# Patient Record
Sex: Female | Born: 1975 | Race: Black or African American | Hispanic: No | Marital: Married | Smoking: Never smoker
Health system: Southern US, Community
[De-identification: ages and names within clinical notes are randomized; demographics above are authoritative.]

## PROBLEM LIST (undated history)

## (undated) DIAGNOSIS — C801 Malignant (primary) neoplasm, unspecified: Secondary | ICD-10-CM

## (undated) DIAGNOSIS — Z803 Family history of malignant neoplasm of breast: Secondary | ICD-10-CM

## (undated) DIAGNOSIS — K649 Unspecified hemorrhoids: Secondary | ICD-10-CM

## (undated) DIAGNOSIS — Z800 Family history of malignant neoplasm of digestive organs: Secondary | ICD-10-CM

## (undated) DIAGNOSIS — Z806 Family history of leukemia: Secondary | ICD-10-CM

## (undated) DIAGNOSIS — J302 Other seasonal allergic rhinitis: Secondary | ICD-10-CM

## (undated) DIAGNOSIS — Z8042 Family history of malignant neoplasm of prostate: Secondary | ICD-10-CM

## (undated) HISTORY — PX: HEMORROIDECTOMY: SUR656

## (undated) HISTORY — DX: Family history of malignant neoplasm of breast: Z80.3

## (undated) HISTORY — DX: Family history of leukemia: Z80.6

## (undated) HISTORY — DX: Family history of malignant neoplasm of digestive organs: Z80.0

## (undated) HISTORY — DX: Unspecified hemorrhoids: K64.9

## (undated) HISTORY — DX: Family history of malignant neoplasm of prostate: Z80.42

## (undated) HISTORY — DX: Other seasonal allergic rhinitis: J30.2

---

## 2001-04-30 ENCOUNTER — Other Ambulatory Visit: Admission: RE | Admit: 2001-04-30 | Discharge: 2001-04-30 | Payer: Self-pay | Admitting: Obstetrics and Gynecology

## 2001-10-20 ENCOUNTER — Inpatient Hospital Stay (HOSPITAL_COMMUNITY): Admission: AD | Admit: 2001-10-20 | Discharge: 2001-10-20 | Payer: Self-pay | Admitting: *Deleted

## 2001-10-21 ENCOUNTER — Inpatient Hospital Stay (HOSPITAL_COMMUNITY): Admission: AD | Admit: 2001-10-21 | Discharge: 2001-10-21 | Payer: Self-pay | Admitting: Obstetrics and Gynecology

## 2001-11-04 ENCOUNTER — Inpatient Hospital Stay (HOSPITAL_COMMUNITY): Admission: AD | Admit: 2001-11-04 | Discharge: 2001-11-04 | Payer: Self-pay | Admitting: *Deleted

## 2001-11-28 ENCOUNTER — Inpatient Hospital Stay (HOSPITAL_COMMUNITY): Admission: AD | Admit: 2001-11-28 | Discharge: 2001-11-30 | Payer: Self-pay | Admitting: *Deleted

## 2002-01-06 ENCOUNTER — Other Ambulatory Visit: Admission: RE | Admit: 2002-01-06 | Discharge: 2002-01-06 | Payer: Self-pay | Admitting: Obstetrics and Gynecology

## 2002-04-13 ENCOUNTER — Emergency Department (HOSPITAL_COMMUNITY): Admission: EM | Admit: 2002-04-13 | Discharge: 2002-04-13 | Payer: Self-pay

## 2003-08-01 ENCOUNTER — Other Ambulatory Visit: Admission: RE | Admit: 2003-08-01 | Discharge: 2003-08-01 | Payer: Self-pay | Admitting: Obstetrics and Gynecology

## 2005-07-25 ENCOUNTER — Inpatient Hospital Stay (HOSPITAL_COMMUNITY): Admission: AD | Admit: 2005-07-25 | Discharge: 2005-07-25 | Payer: Self-pay | Admitting: Obstetrics and Gynecology

## 2005-08-19 ENCOUNTER — Inpatient Hospital Stay (HOSPITAL_COMMUNITY): Admission: AD | Admit: 2005-08-19 | Discharge: 2005-08-21 | Payer: Self-pay | Admitting: Obstetrics and Gynecology

## 2008-07-17 ENCOUNTER — Inpatient Hospital Stay (HOSPITAL_COMMUNITY): Admission: AD | Admit: 2008-07-17 | Discharge: 2008-07-17 | Payer: Self-pay | Admitting: Obstetrics

## 2008-08-06 ENCOUNTER — Inpatient Hospital Stay (HOSPITAL_COMMUNITY): Admission: AD | Admit: 2008-08-06 | Discharge: 2008-08-07 | Payer: Self-pay | Admitting: Obstetrics and Gynecology

## 2010-04-13 ENCOUNTER — Ambulatory Visit (HOSPITAL_COMMUNITY): Admission: RE | Admit: 2010-04-13 | Discharge: 2010-04-13 | Payer: Self-pay | Admitting: Obstetrics and Gynecology

## 2010-12-15 LAB — CBC
MCH: 30.9 pg (ref 26.0–34.0)
MCHC: 33.9 g/dL (ref 30.0–36.0)
Platelets: 248 10*3/uL (ref 150–400)
RDW: 13.2 % (ref 11.5–15.5)

## 2010-12-15 LAB — PREGNANCY, URINE: Preg Test, Ur: NEGATIVE

## 2011-02-15 NOTE — Consult Note (Signed)
NAME:  Anita Mora, Anita Mora               ACCOUNT NO.:  [id number]   MEDICAL RECORD NO.:  [id number]          PATIENT TYPE:  MAT   LOCATION:  [facility]                          FACILITY:  [facility]   PHYSICIAN:  Lenoard Aden, M.D.DATE OF BIRTH:  12-02-1975   DATE OF CONSULTATION:  07/25/2005  DATE OF DISCHARGE:                                   CONSULTATION   CHIEF COMPLAINT:  Rule out rupture of membranes.   HISTORY OF PRESENT ILLNESS:  She is a 35 year old African-American female,  G3, P1, EDD August 28, 2005, at 107 weeks' gestation with questionable  leakage of fluid today.  She has no known drug allergies.  Medications are  prenatal vitamins.  She has a history of a TAB in 1999 and a spontaneous  vaginal delivery in 2003.  She is a nonsmoker, nondrinker.  __________.  She  has a family history of diabetes, hypertension, congestive heart failure,  myocardial infarction.  She has a personal history of hemorrhoidal surgery  in 2003 and a pilonidal cyst removal.  Prenatal course complicated by  thrombosed hemorrhoid which was incised and drained in the office.   PRENATAL LAB DATA:  Blood type B positive, Rh antibody negative, rubella  immune, hepatitis and HIV negative.  GBS is not performed to this date.   PHYSICAL EXAMINATION:  GENERAL:  She is a well-developed, well-nourished  African-American female in no acute distress.  HEENT:  Normal.  LUNGS:  Clear.  HEART:  Regular rate and rhythm.  ABDOMEN:  Soft, gravid, and nontender.  NST is reactive, irregular  contractions noted.  CERVIX:  __________ long, vertex, and ballottable.   IMPRESSION:  1.  Thirty-five week OB.  2.  Fern and Nitrazine negative, no evidence of spontaneous rupture of      membranes.   PLAN:  Discharge home.  Preterm labor precautions given.  Follow up in the  office is scheduled.      Lenoard Aden, M.D.  Electronically Signed     RJT/MEDQ  D:  07/25/2005  T:  07/25/2005  Job:  [id number]

## 2011-07-02 LAB — URINE CULTURE: Special Requests: NEGATIVE

## 2011-07-02 LAB — CBC
HCT: 33.2 — ABNORMAL LOW
MCHC: 34.5
MCV: 95.3
Platelets: 195
Platelets: 209
RBC: 3.48 — ABNORMAL LOW
RDW: 13.6

## 2011-07-02 LAB — URINALYSIS, DIPSTICK ONLY
Nitrite: NEGATIVE
Specific Gravity, Urine: 1.02
Urobilinogen, UA: 0.2
pH: 7.5

## 2014-02-21 ENCOUNTER — Encounter: Payer: Self-pay | Admitting: Emergency Medicine

## 2014-02-21 ENCOUNTER — Emergency Department (INDEPENDENT_AMBULATORY_CARE_PROVIDER_SITE_OTHER)
Admission: EM | Admit: 2014-02-21 | Discharge: 2014-02-21 | Disposition: A | Discharge status: Home / Self Care | Payer: 59 | Source: Home / Self Care | Attending: Family Medicine | Admitting: Family Medicine

## 2014-02-21 ENCOUNTER — Emergency Department (INDEPENDENT_AMBULATORY_CARE_PROVIDER_SITE_OTHER): Payer: 59

## 2014-02-21 DIAGNOSIS — M25579 Pain in unspecified ankle and joints of unspecified foot: Secondary | ICD-10-CM

## 2014-02-21 DIAGNOSIS — S93602A Unspecified sprain of left foot, initial encounter: Secondary | ICD-10-CM

## 2014-02-21 DIAGNOSIS — R9389 Abnormal findings on diagnostic imaging of other specified body structures: Secondary | ICD-10-CM

## 2014-02-21 DIAGNOSIS — S93609A Unspecified sprain of unspecified foot, initial encounter: Secondary | ICD-10-CM

## 2014-02-21 MED ORDER — MELOXICAM 15 MG PO TABS: 15.0000 mg | ORAL_TABLET | Freq: Every day | ORAL | Status: DC

## 2014-02-21 MED ORDER — HYDROCODONE-ACETAMINOPHEN 5-325 MG PO TABS: 1.0000 | ORAL_TABLET | Freq: Four times a day (QID) | ORAL | Status: DC | PRN

## 2014-02-21 NOTE — Discharge Instructions (Signed)
Continue ace wrap.  Apply ice pack for 20 to 30 minutes, 3 to 4 times daily  Continue until pain decreases.  Elevate leg.  Use crutches.   Foot Sprain The muscles and cord like structures which attach muscle to bone (tendons) that surround the feet are made up of units. A foot sprain can occur at the weakest spot in any of these units. This condition is most often caused by injury to or overuse of the foot, as from playing contact sports, or aggravating a previous injury, or from poor conditioning, or obesity. SYMPTOMS  Pain with movement of the foot.  Tenderness and swelling at the injury site.  Loss of strength is present in moderate or severe sprains. THE THREE GRADES OR SEVERITY OF FOOT SPRAIN ARE:  Mild (Grade I): Slightly pulled muscle without tearing of muscle or tendon fibers or loss of strength.  Moderate (Grade II): Tearing of fibers in a muscle, tendon, or at the attachment to bone, with small decrease in strength.  Severe (Grade III): Rupture of the muscle-tendon-bone attachment, with separation of fibers. Severe sprain requires surgical repair. Often repeating (chronic) sprains are caused by overuse. Sudden (acute) sprains are caused by direct injury or over-use. DIAGNOSIS  Diagnosis of this condition is usually by your own observation. If problems continue, a caregiver may be required for further evaluation and treatment. X-rays may be required to make sure there are not breaks in the bones (fractures) present. Continued problems may require physical therapy for treatment. PREVENTION  Use strength and conditioning exercises appropriate for your sport.  Warm up properly prior to working out.  Use athletic shoes that are made for the sport you are participating in.  Allow adequate time for healing. Early return to activities makes repeat injury more likely, and can lead to an unstable arthritic foot that can result in prolonged disability. Mild sprains generally heal in 3 to  10 days, with moderate and severe sprains taking 2 to 10 weeks. Your caregiver can help you determine the proper time required for healing. HOME CARE INSTRUCTIONS   Apply ice to the injury for 15-20 minutes, 03-04 times per day. Put the ice in a plastic bag and place a towel between the bag of ice and your skin.  An elastic wrap (like an Ace bandage) may be used to keep swelling down.  Keep foot above the level of the heart, or at least raised on a footstool, when swelling and pain are present.  Try to avoid use other than gentle range of motion while the foot is painful. Do not resume use until instructed by your caregiver. Then begin use gradually, not increasing use to the point of pain. If pain does develop, decrease use and continue the above measures, gradually increasing activities that do not cause discomfort, until you gradually achieve normal use.  Use crutches if and as instructed, and for the length of time instructed.  Keep injured foot and ankle wrapped between treatments.  Massage foot and ankle for comfort and to keep swelling down. Massage from the toes up towards the knee.  Only take over-the-counter or prescription medicines for pain, discomfort, or fever as directed by your caregiver. SEEK IMMEDIATE MEDICAL CARE IF:   Your pain and swelling increase, or pain is not controlled with medications.  You have loss of feeling in your foot or your foot turns cold or blue.  You develop new, unexplained symptoms, or an increase of the symptoms that brought you to your caregiver.  MAKE SURE YOU:   Understand these instructions.  Will watch your condition.  Will get help right away if you are not doing well or get worse. Document Released: 03/08/2002 Document Revised: 12/09/2011 Document Reviewed: 05/05/2008 [facility] Patient Information 2014 [place], [place].

## 2014-02-21 NOTE — ED Provider Notes (Signed)
CSN: [id number]     Arrival date & time 02/21/14  1932 History   First MD Initiated Contact with Patient 02/21/14 1946     Chief Complaint  Patient presents with  . Ankle Pain     HPI Comments: Patient was playing softball today.  While sliding into second, she twisted her left foot/ankle and heard a popping sound.  She has pain in her medial foot/ankle.  Patient is a 38 y.o. female presenting with ankle pain. The history is provided by the patient.  Ankle Pain Location:  Ankle and foot Time since incident:  2 hours Injury: yes   Mechanism of injury comment:  Playing softball Ankle location:  L ankle Foot location:  L foot Pain details:    Quality:  Shooting   Radiates to:  L leg   Severity:  Moderate   Onset quality:  Sudden   Duration:  2 hours   Timing:  Constant   Progression:  Unchanged Chronicity:  New Prior injury to area:  No Relieved by:  Nothing Worsened by:  Bearing weight Ineffective treatments: ace wrap. Associated symptoms: decreased ROM, stiffness and swelling   Associated symptoms: no back pain, no muscle weakness, no numbness and no tingling     History reviewed. No pertinent past medical history. Past Surgical History  Procedure Laterality Date  . Hemorroidectomy     Family History  Problem Relation Age of Onset  . Hypertension Mother    History  Substance Use Topics  . Smoking status: Never Smoker   . Smokeless tobacco: Not on file  . Alcohol Use: Yes     Comment: occassional   OB History   Grav Para Term Preterm Abortions TAB SAB Ect Mult Living                 Review of Systems  Musculoskeletal: Positive for stiffness. Negative for back pain.    Allergies  Review of patient's allergies indicates no known allergies.  Home Medications   Prior to Admission medications   Medication Sig Start Date End Date Taking? Authorizing Provider  MULTIPLE VITAMIN PO Take by mouth.   Yes Historical Provider, MD   BP 108/71  Pulse 92  Temp(Src)  99.8 F (37.7 C) (Oral)  Resp 18  SpO2 100%  LMP 02/10/2014 Physical Exam  Nursing note and vitals reviewed. Constitutional: She is oriented to person, place, and time. She appears well-developed and well-nourished. No distress.  HENT:  Head: Atraumatic.  Eyes: Conjunctivae are normal. Pupils are equal, round, and reactive to light.  Musculoskeletal:       Left ankle: She exhibits decreased range of motion. She exhibits no swelling, no ecchymosis, no deformity, no laceration and normal pulse. Tenderness. Medial malleolus tenderness found. No lateral malleolus, no AITFL, no CF ligament, no posterior TFL, no head of 5th metatarsal and no proximal fibula tenderness found. Achilles tendon normal.       Feet:  Left ankle has tenderness over medial malleolus and navicular, extending to dorsum of foot.  Distal neurovascular function is intact.   Neurological: She is alert and oriented to person, place, and time.  Skin: Skin is warm and dry.    ED Course  Procedures      Imaging Review Dg Ankle Complete Left  02/21/2014   CLINICAL DATA:  38 year old female with left ankle injury and pain.  EXAM: LEFT ANKLE COMPLETE - 3+ VIEW  COMPARISON:  None.  FINDINGS: There is no evidence of fracture, dislocation, or joint  effusion. There is no evidence of arthropathy or other focal bone abnormality. Soft tissues are unremarkable.  IMPRESSION: Negative.   Electronically Signed   By: Hassan Rowan M.D.   On: 02/21/2014 20:24   Dg Foot Complete Left  02/21/2014   CLINICAL DATA:  Pain post injury playing softball  EXAM: LEFT FOOT - COMPLETE 3+ VIEW  COMPARISON:  None.  FINDINGS: Three views of the left foot submitted. No acute fracture. There is mild misalignment of talonavicular joint. Ligamental injury or subtle inferior subluxation of the navicular cannot be excluded. Clinical correlation is necessary. Further evaluation with MRI could be performed.  IMPRESSION: No acute fracture. There is mild misalignment of  talonavicular joint. Ligamental injury or subtle inferior subluxation of the navicular cannot be excluded. Clinical correlation is necessary. Further evaluation with MRI could be performed.   Electronically Signed   By: Lahoma Crocker M.D.   On: 02/21/2014 20:26     MDM   1. Sprain of foot, left    Ace wrap applied.  Administered 800mg  Ibuprofen.  Rx for Lortab.  Begin Mobic tomorrow. Continue ace wrap.  Apply ice pack for 20 to 30 minutes, 3 to 4 times daily  Continue until pain decreases.  Elevate leg.  Use crutches. Followup with Dr. Aundria Mems as soon as possible.    Kandra Nicolas, MD 02/21/14 [phone]

## 2014-02-21 NOTE — ED Notes (Signed)
Pt c/o LT ankle pain post injury playing softball today.  She reports hearing a "pop".

## 2014-02-22 ENCOUNTER — Telehealth: Payer: Self-pay | Admitting: *Deleted

## 2014-02-23 ENCOUNTER — Encounter: Payer: Self-pay | Admitting: Sports Medicine

## 2014-02-23 ENCOUNTER — Ambulatory Visit (INDEPENDENT_AMBULATORY_CARE_PROVIDER_SITE_OTHER): Payer: 59 | Admitting: Sports Medicine

## 2014-02-23 ENCOUNTER — Ambulatory Visit (INDEPENDENT_AMBULATORY_CARE_PROVIDER_SITE_OTHER): Payer: 59

## 2014-02-23 VITALS — BP 141/80 | HR 106 | Ht 68.0 in | Wt 191.0 lb

## 2014-02-23 DIAGNOSIS — S93402A Sprain of unspecified ligament of left ankle, initial encounter: Secondary | ICD-10-CM | POA: Insufficient documentation

## 2014-02-23 DIAGNOSIS — S93409A Sprain of unspecified ligament of unspecified ankle, initial encounter: Secondary | ICD-10-CM

## 2014-02-23 DIAGNOSIS — Z5189 Encounter for other specified aftercare: Secondary | ICD-10-CM

## 2014-02-23 NOTE — Assessment & Plan Note (Signed)
Clinically this does represent a lateral ankle sprain. Radiologically she does have a misalignment between the talus and the navicular, with dorsal misalignment of the talus. I am going to x-ray her contralateral foot to see if this is simply a normal variant. Strap the foot with compressive dressing, continue cast boot, crutches. Home rehabilitation exercises, Mobic patient already has, return to see me in 2 weeks.

## 2014-02-23 NOTE — Progress Notes (Signed)
   Subjective:    I'm seeing this patient as a consultation for:  Dr. Assunta Found  CC: Left ankle injury  HPI: This is a very pleasant 38 year old female, she was playing softball, rounded the base, and inverted her left ankle. She had immediate pain, swelling, and inability to bear weight. She was seen in urgent care where x-ray showed what appeared to be a malalignment between the talus and the navicular, she was placed in a boot and referred to me for further evaluation and definitive treatment. Pain is moderate, persistent. Localized predominately of the lateral ankle. She only has minimal pain at the talonavicular joint.  Past medical history, Surgical history, Family history not pertinant except as noted below, Social history, Allergies, and medications have been entered into the medical record, reviewed, and no changes needed.   Review of Systems: No headache, visual changes, nausea, vomiting, diarrhea, constipation, dizziness, abdominal pain, skin rash, fevers, chills, night sweats, weight loss, swollen lymph nodes, body aches, joint swelling, muscle aches, chest pain, shortness of breath, mood changes, visual or auditory hallucinations.   Objective:   General: Well Developed, well nourished, and in no acute distress.  Neuro/Psych: Alert and oriented x3, extra-ocular muscles intact, able to move all 4 extremities, sensation grossly intact. Skin: Warm and dry, no rashes noted.  Respiratory: Not using accessory muscles, speaking in full sentences, trachea midline.  Cardiovascular: Pulses palpable, no extremity edema. Abdomen: Does not appear distended. Left Ankle: Visibly swollen, no bruising. Range of motion is full in all directions. Strength is 5/5 in all directions. Stable medial ligaments; squeeze test and kleiger test unremarkable; negative talar tilt. Talar dome nontender; No pain at base of 5th MT; No tenderness over cuboid; No tenderness over N spot or navicular prominence Only  minimal tenderness to palpation of the talonavicular joint, predominately painful over the ATF L., with a mildly positive anterior drawer sign. No tenderness on posterior aspects of lateral and medial malleolus No sign of peroneal tendon subluxations or tenderness to palpation Negative tarsal tunnel tinel's Able to walk 4 steps.  On the lateral view of the left foot there does appear to be some dorsal malalignment of the talus with respect to the navicular. On imaging of the right foot, alignment is similar suggesting that this is a normal variant.  The foot was then strapped with compressive dressing and placed back into the cast boot.  Impression and Recommendations:   This case required medical decision making of moderate complexity.

## 2014-02-25 ENCOUNTER — Telehealth: Payer: Self-pay | Admitting: *Deleted

## 2014-02-25 NOTE — Telephone Encounter (Signed)
Pt called and stated that she has been doing the exercises that Dr. Darene Lamer gave her and she is still in pain. Pt states that Dr. Darene Lamer told her that she should improve in 2 weeks and she has not. She has been elevating her foot, and using ice Q 2-3 hours. She has other concerns about the boot that she has been wearing she stated that it's hot and wanted to know if she can be put in a shorter boot, lastly she wanted to know if she needed a scooter or if this is too much because the crutches are too much? 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 Please advise.Teddy Spike

## 2014-02-25 NOTE — Telephone Encounter (Signed)
I said 2 weeks, it's only been 2 days, I am happy to give her a shorter boot, they are expensive though, other option would be a cast. I am also happy to give her a prescription for a knee scooter, she just needs to let me know if she wants this. Is the hydrocodone sufficient for controlling pain?

## 2014-02-28 NOTE — Telephone Encounter (Signed)
lvm informing pt of recommendations and to call back .Anita Mora

## 2014-03-09 ENCOUNTER — Ambulatory Visit: Payer: 59 | Admitting: Sports Medicine

## 2014-03-11 ENCOUNTER — Ambulatory Visit (INDEPENDENT_AMBULATORY_CARE_PROVIDER_SITE_OTHER): Payer: 59 | Admitting: Sports Medicine

## 2014-03-11 ENCOUNTER — Encounter: Payer: Self-pay | Admitting: Sports Medicine

## 2014-03-11 VITALS — BP 109/67 | HR 86 | Ht 67.0 in | Wt 191.0 lb

## 2014-03-11 DIAGNOSIS — S93402A Sprain of unspecified ligament of left ankle, initial encounter: Secondary | ICD-10-CM

## 2014-03-11 DIAGNOSIS — S93409A Sprain of unspecified ligament of unspecified ankle, initial encounter: Secondary | ICD-10-CM

## 2014-03-11 NOTE — Progress Notes (Signed)
    Subjective:    CC: Follow up  HPI: Left ankle sprain: X-rays were negative, has now been in a cast boot for 2 weeks, pain is significantly better. Mild, improving.  Past medical history, Surgical history, Family history not pertinant except as noted below, Social history, Allergies, and medications have been entered into the medical record, reviewed, and no changes needed.   Review of Systems: No fevers, chills, night sweats, weight loss, chest pain, or shortness of breath.   Objective:    General: Well Developed, well nourished, and in no acute distress.  Neuro: Alert and oriented x3, extra-ocular muscles intact, sensation grossly intact.  HEENT: Normocephalic, atraumatic, pupils equal round reactive to light, neck supple, no masses, no lymphadenopathy, thyroid nonpalpable.  Skin: Warm and dry, no rashes. Cardiac: Regular rate and rhythm, no murmurs rubs or gallops, no lower extremity edema. Respiratory: Clear to auscultation bilaterally. Not using accessory muscles, speaking in full sentences. Left Ankle: Only minimal residual swelling Range of motion is full in all directions. Strength is 5/5 in all directions. Stable lateral and medial ligaments; squeeze test and kleiger test unremarkable; only minimal tenderness to palpation over the ATF L. There is also mild tenderness to palpation on the fibular shaft distally. Talar dome nontender; No pain at base of 5th MT; No tenderness over cuboid; No tenderness over N spot or navicular prominence No tenderness on posterior aspects of lateral and medial malleolus No sign of peroneal tendon subluxations or tenderness to palpation Negative tarsal tunnel tinel's Able to walk 4 steps.  Impression and Recommendations:

## 2014-03-11 NOTE — Assessment & Plan Note (Signed)
Continues to improve, continue boot for an additional week, then transition into regular footwear. Strapped with compressive dressing. Return in 3 weeks.

## 2014-04-04 ENCOUNTER — Encounter: Payer: Self-pay | Admitting: Sports Medicine

## 2014-04-04 ENCOUNTER — Ambulatory Visit (INDEPENDENT_AMBULATORY_CARE_PROVIDER_SITE_OTHER): Payer: 59 | Admitting: Sports Medicine

## 2014-04-04 ENCOUNTER — Telehealth: Payer: Self-pay | Admitting: *Deleted

## 2014-04-04 VITALS — BP 103/69 | HR 81 | Ht 68.0 in | Wt 187.0 lb

## 2014-04-04 DIAGNOSIS — S93402D Sprain of unspecified ligament of left ankle, subsequent encounter: Secondary | ICD-10-CM

## 2014-04-04 DIAGNOSIS — S93409A Sprain of unspecified ligament of unspecified ankle, initial encounter: Secondary | ICD-10-CM

## 2014-04-04 MED ORDER — MELOXICAM 15 MG PO TABS: ORAL_TABLET | ORAL | Status: DC

## 2014-04-04 NOTE — Progress Notes (Signed)
  Subjective:    CC: Followup  HPI: This is a very pleasant 38 year old female, one month ago she had an inversion injury to her left ankle, and we treated her conservatively with a cast boot, home rehabilitation, and anti-inflammatories.  She has improved significantly but she has still some pain at the 1st MTP and over the ATFL  She is now off Mobic and is wearing flip-flops.  Past medical history, Surgical history, Family history not pertinant except as noted below, Social history, Allergies, and medications have been entered into the medical record, reviewed, and no changes needed.   Review of Systems: No fevers, chills, night sweats, weight loss, chest pain, or shortness of breath.   Objective:    General: Well Developed, well nourished, and in no acute distress.  Neuro: Alert and oriented x3, extra-ocular muscles intact, sensation grossly intact.  HEENT: Normocephalic, atraumatic, pupils equal round reactive to light, neck supple, no masses, no lymphadenopathy, thyroid nonpalpable.  Skin: Warm and dry, no rashes. Cardiac: Regular rate and rhythm, no murmurs rubs or gallops, no lower extremity edema.  Respiratory: Clear to auscultation bilaterally. Not using accessory muscles, speaking in full sentences. Left Ankle: No visible erythema or swelling. Range of motion is full in all directions. Strength is 5/5 in all directions. Stable lateral and medial ligaments; squeeze test and kleiger test unremarkable; Still tender over the ATFL and the lateral talar dome No pain at base of 5th MT; No tenderness over cuboid; No tenderness over N spot or navicular prominence No tenderness on posterior aspects of lateral and medial malleolus No sign of peroneal tendon subluxations or tenderness to palpation Negative tarsal tunnel tinel's Able to walk 4 steps.  Impression and Recommendations:

## 2014-04-04 NOTE — Telephone Encounter (Signed)
MRI of ankle was denied and peer to peer review needs done. REF# [id number]

## 2014-04-04 NOTE — Assessment & Plan Note (Signed)
Continues to be painful over the ATF L., with what sounds to be peroneal tendon subluxation. She is also having some pain at the first metatarsophalangeal joint. At this point we are going to proceed with an MRI of the ankle. Return to go over MRI results, will back into ASO brace and restart Mobic. She did decline a first metatarsophalangeal joint injection today.

## 2014-04-07 ENCOUNTER — Telehealth: Payer: Self-pay | Admitting: *Deleted

## 2014-04-07 NOTE — Telephone Encounter (Signed)
Peer to peer completed and approval from [facility] rec'd - [id number] good 04/06/14-05/21/14. Erin @ radiology notified.  Margette Fast, CMA

## 2014-07-22 ENCOUNTER — Telehealth: Payer: Self-pay

## 2014-07-22 DIAGNOSIS — M25572 Pain in left ankle and joints of left foot: Secondary | ICD-10-CM

## 2014-07-22 NOTE — Telephone Encounter (Signed)
Spoke to patient advised her that order for MRI was placed. Anita Mora,CMA

## 2014-07-22 NOTE — Telephone Encounter (Signed)
Patient request another order for MRI of the ankle. She stated that she was unable to get previous ones done due to her having to reschedule many missed appts.  Rhonda Cunningham,CMA

## 2014-07-22 NOTE — Telephone Encounter (Signed)
Orders placed, this was previously approved

## 2014-07-26 ENCOUNTER — Telehealth: Payer: Self-pay | Admitting: *Deleted

## 2014-07-26 NOTE — Telephone Encounter (Signed)
MRI approval for [id number] (ankle) [id number] expires 09/02/14. Radiology notified. Margette Fast, RMA

## 2014-08-22 ENCOUNTER — Other Ambulatory Visit: Payer: 59

## 2020-09-28 ENCOUNTER — Other Ambulatory Visit: Payer: Self-pay | Admitting: Radiology

## 2020-10-04 ENCOUNTER — Other Ambulatory Visit: Payer: Self-pay | Admitting: Radiology

## 2020-10-04 DIAGNOSIS — C50912 Malignant neoplasm of unspecified site of left female breast: Secondary | ICD-10-CM

## 2020-10-06 ENCOUNTER — Encounter: Payer: Self-pay | Admitting: *Deleted

## 2020-10-06 DIAGNOSIS — C773 Secondary and unspecified malignant neoplasm of axilla and upper limb lymph nodes: Secondary | ICD-10-CM | POA: Insufficient documentation

## 2020-10-06 DIAGNOSIS — C50919 Malignant neoplasm of unspecified site of unspecified female breast: Secondary | ICD-10-CM | POA: Insufficient documentation

## 2020-10-09 ENCOUNTER — Other Ambulatory Visit: Payer: Self-pay

## 2020-10-09 ENCOUNTER — Encounter: Payer: Self-pay | Admitting: Obstetrics and Gynecology

## 2020-10-09 ENCOUNTER — Ambulatory Visit
Admission: RE | Admit: 2020-10-09 | Discharge: 2020-10-09 | Disposition: A | Discharge status: Home / Self Care | Payer: Managed Care, Other (non HMO) | Source: Ambulatory Visit | Attending: Radiology | Admitting: Radiology

## 2020-10-09 DIAGNOSIS — C50912 Malignant neoplasm of unspecified site of left female breast: Secondary | ICD-10-CM

## 2020-10-09 MED ORDER — GADOBUTROL 1 MMOL/ML IV SOLN
8.0000 mL | Freq: Once | INTRAVENOUS | Status: AC | PRN
  Administered 2020-10-09: 8 mL via INTRAVENOUS

## 2020-10-10 NOTE — Progress Notes (Signed)
Anita Mora  [phone] [phone]) [phone]     ID: Anita Mora DOB: 1976-08-19  MR#: [id number]  AJO#:[id number]  Patient Care Team: Pcp, No as PCP - General Anita Germany, RN as Oncology Nurse Navigator Anita Kaufmann, RN as Oncology Nurse Navigator Anita Luna, MD as Consulting Physician (General Surgery) Anita Mora, Anita Dad, MD as Consulting Physician (Oncology) Anita Gibson, MD as Attending Physician (Radiation Oncology) Anita Cruel, MD OTHER MD:  CHIEF COMPLAINT: triple positive breast cancer  CURRENT TREATMENT: Neoadjuvant chemotherapy   HISTORY OF CURRENT ILLNESS: Anita Mora had routine screening mammography on 08/21/2020 showing a possible abnormality in the left axilla. She underwent left breast ultrasonography at [facility] on 09/18/2020 showing: three abnormal lymph nodes, at least two of which have lost their fatty hila and are suspicious for possible malignancy; 1.4 cm irregular mass in left breast at 3 o'clock; 1.7 cm oval simple cyst in left breast at 2 o'clock is benign.  Accordingly on 09/28/2020 she proceeded to biopsy of the left axilla area in question. The pathology from this procedure ([id number]) showed: lymph node with metastatic carcinoma. Prognostic indicators significant for: estrogen receptor, 50% positive and progesterone receptor, 80% positive, both with moderate staining intensity. Proliferation marker Ki67 at 30%. HER2 positive by immunohistochemistry (3+).  The left breast mass at 3 o'clock was also biopsied at that time and showed a fibroepithelial lesion. No invasive carcinoma was identified.  She underwent breast MRI on 10/09/2020 showing: breast composition C; two abnormal-appearing left axillary lymph nodes, one of which is biopsy-proven to contain metastatic disease; initially no suspicious findings within left breast were noted, but with subsequent review a 0.6 cm area in the left breast will need  biopsy; also an indeterminate 1 cm lower-inner right breast mass will require biopsy as well   The patient's subsequent history is as detailed below.   INTERVAL HISTORY: Anita Mora was evaluated in the multidisciplinary breast cancer clinic on 10/11/2020 accompanied by her Anita Mora her case was also presented at the multidisciplinary breast cancer conference on the same day. At that time a preliminary plan was proposed: Staging studies, neoadjuvant chemotherapy, genetics, definitive surgery, radiation, antiestrogens   REVIEW OF SYSTEMS: There were no specific symptoms leading to the original mammogram, which was routinely scheduled. The patient denies unusual headaches, visual changes, nausea, vomiting, stiff neck, dizziness, or gait imbalance. There has been no cough, phlegm production, or pleurisy, no chest pain or pressure, and no change in bowel or bladder habits. The patient denies fever, rash, bleeding, unexplained fatigue or unexplained weight loss. A detailed review of systems was otherwise entirely negative.   COVID 19 VACCINATION STATUS: Status post Pfizer January 2022   PAST MEDICAL HISTORY: Past Medical History:  Diagnosis Date  . Family history of breast cancer   . Family history of colon cancer   . Family history of leukemia   . Family history of prostate cancer   . Hemorrhoids   . Seasonal allergies     PAST SURGICAL HISTORY: Past Surgical History:  Procedure Laterality Date  . HEMORROIDECTOMY      FAMILY HISTORY: Family History  Problem Relation Age of Onset  . Hypertension Mother   . Breast cancer Mother 54       gene testing  - neg at Anita Mora  . Colon cancer Maternal Grandmother        dx 80s  . Prostate cancer Paternal Uncle        dx 95s  .  Prostate cancer Maternal Uncle        60's  . Aneurysm Maternal Grandfather 76  . Stroke Paternal Grandmother   . Leukemia Other 10       Paternal cousin's son  . Prostate cancer Maternal Uncle 80  . Prostate  cancer Paternal Uncle        dx 44s   Her parents are both living-- father at 3, mother at 56-- as of 09/2020. Anita Mora has one brother and one Anita. She reports breast cancer in her mother at age 25 (she had genetic testing done at [facility], which was negative), prostate cancer in two paternal uncles (both in their 18's) and two maternal uncles (one at 58, one in 69's), and colon cancer in her maternal grandmother in her 30's.   GYNECOLOGIC HISTORY:  No LMP recorded. Menarche: 45 years old Age at first live birth: 45 years old [place] P 3 LMP 10/06/2020 Contraceptive: used for more than 2 years with no complications; husband now s/p vasectomy HRT n/a  Hysterectomy? no BSO? no   SOCIAL HISTORY: (updated 09/2020)  Anita Mora works as a [employer]. Spouse Anita Mora is a Music therapist with [employer]. She lives at home with Anita Mora, two of their children-- Anita Mora age 11, and Anita Mora age 61-- and their dog. Their third child, their daughter Anita Mora, age 80, is at [employer] in [place]. Anita Mora attends [employer].    ADVANCED DIRECTIVES: in place   HEALTH MAINTENANCE: Social History   Tobacco Use  . Smoking status: Never Smoker  Substance Use Topics  . Alcohol use: Yes    Comment: occassional  . Drug use: No     Colonoscopy: n/a (age)  PAP: up to date  Bone density: n/a (age)   No Known Allergies  Current Outpatient Medications  Medication Sig Dispense Refill  . meloxicam (MOBIC) 15 MG tablet One tab PO qAM with breakfast for 2 weeks, then daily prn pain. 30 tablet 3  . MULTIPLE VITAMIN PO Take by mouth.     No current facility-administered medications for this visit.    OBJECTIVE: African-American woman who appears well  Vitals:   10/11/20 0917  BP: 121/72  Pulse: 83  Resp: 18  Temp: (!) 97.5 F (36.4 C)  SpO2: 100%     Body mass index is 29.28 kg/m.   Wt Readings from Last 3 Encounters:  10/11/20 192 lb 9.6 oz (87.4 kg)  04/04/14 187 lb (84.8 kg)   03/11/14 191 lb (86.6 kg)      ECOG FS:0 - Asymptomatic  Ocular: Sclerae unicteric, pupils round and equal Ear-nose-throat: Wearing a mask Lymphatic: No cervical or supraclavicular adenopathy Lungs no rales or rhonchi Heart regular rate and rhythm Abd soft, nontender, positive bowel sounds MSK no focal spinal tenderness, no joint edema Neuro: non-focal, well-oriented, appropriate affect Breasts: The right breast is unremarkable.  The right breast is unremarkable as well.  In the left axilla there is an easily palpable mass which appears fixed, measuring approximately 2 cm   LAB RESULTS:  CMP     Component Value Date/Time   NA 137 10/11/2020 0845   K 4.5 10/11/2020 0845   CL 103 10/11/2020 0845   CO2 29 10/11/2020 0845   GLUCOSE 96 10/11/2020 0845   BUN 14 10/11/2020 0845   CREATININE 1.07 (H) 10/11/2020 0845   CALCIUM 9.6 10/11/2020 0845   PROT 8.2 (H) 10/11/2020 0845   ALBUMIN 4.0 10/11/2020 0845   AST 19 10/11/2020 0845  ALT 12 10/11/2020 0845   ALKPHOS 47 10/11/2020 0845   BILITOT 0.7 10/11/2020 0845   GFRNONAA >60 10/11/2020 0845    No results found for: TOTALPROTELP, ALBUMINELP, A1GS, A2GS, BETS, BETA2SER, GAMS, MSPIKE, SPEI  Lab Results  Component Value Date   WBC 4.9 10/11/2020   NEUTROABS 2.4 10/11/2020   HGB 11.3 (L) 10/11/2020   HCT 34.0 (L) 10/11/2020   MCV 89.0 10/11/2020   PLT 254 10/11/2020    No results found for: LABCA2  No components found for: ZWCHEN277  No results for input(s): INR in the last 168 hours.  No results found for: LABCA2  No results found for: OEU235  No results found for: TIR443  No results found for: XVQ008  No results found for: CA2729  No components found for: HGQUANT  No results found for: CEA1 / No results found for: CEA1   No results found for: AFPTUMOR  No results found for: CHROMOGRNA  No results found for: KPAFRELGTCHN, LAMBDASER, KAPLAMBRATIO (kappa/lambda light chains)  No results found for:  HGBA, HGBA2QUANT, HGBFQUANT, HGBSQUAN (Hemoglobinopathy evaluation)   No results found for: LDH  No results found for: IRON, TIBC, IRONPCTSAT (Iron and TIBC)  No results found for: FERRITIN  Urinalysis    Component Value Date/Time   LABSPEC 1.020 08/06/2008 0745   PHURINE 7.5 08/06/2008 0745   GLUCOSEU NEGATIVE 08/06/2008 0745   HGBUR LARGE (A) 08/06/2008 0745   BILIRUBINUR SMALL (A) 08/06/2008 0745   KETONESUR NEGATIVE 08/06/2008 0745   PROTEINUR 100 (A) 08/06/2008 0745   UROBILINOGEN 0.2 08/06/2008 0745   NITRITE NEGATIVE 08/06/2008 0745   LEUKOCYTESUR TRACE (A) 08/06/2008 0745     STUDIES: MR BREAST BILATERAL W WO CONTRAST INC CAD  Addendum Date: 10/11/2020   ADDENDUM REPORT: 10/11/2020 10:22 ADDENDUM: After further review, a 0.4 cm focus within the posterior central LEFT breast (series 12: Image 69) is identified. Given metastatic breast cancer within a LEFT axillary lymph node and no other suspicious abnormality within the LEFT breast, tissue sampling would be recommended to exclude malignancy. RECOMMENDATION: MR guided LEFT breast biopsy. BI-RADS category:  4: Suspicious. Dr. [name] is aware of this change. Electronically Signed   By: [name] M.D.   On: 10/11/2020 10:22   Result Date: 10/11/2020 CLINICAL DATA:  45 year old female with recent diagnosis of metastatic carcinoma within a LEFT axillary lymph node. Recent biopsy of RETROAREOLAR LEFT breast mass demonstrating a fibroepithelial lesion favored to be a fibroadenoma. LABS:  None performed today EXAM: BILATERAL BREAST MRI WITH AND WITHOUT CONTRAST TECHNIQUE: Multiplanar, multisequence MR images of both breasts were obtained prior to and following the intravenous administration of 8 ml of Gadavist Three-dimensional MR images were rendered by post-processing of the original MR data on an independent workstation. The three-dimensional MR images were interpreted, and findings are reported in the following complete MRI  report for this study. Three dimensional images were evaluated at the independent interpreting workstation using the DynaCAD thin client. COMPARISON:  Prior mammogram and ultrasounds from [facility] FINDINGS: Breast composition: c. Heterogeneous fibroglandular tissue. Background parenchymal enhancement: Mild Right breast: A 1 cm irregular mass is identified within the posterior LOWER INNER RIGHT breast (series 12: Image 98). No other suspicious mass or worrisome enhancement is identified. Multiple benign cysts are present throughout the RIGHT breast. Left breast: Biopsy clip artifact within the OUTER LEFT breast, middle depth noted. No suspicious abnormalities are identified within the LEFT breast. Multiple cysts are present throughout the LEFT breast. Lymph nodes: 2 abnormal appearing  LEFT axillary lymph nodes are identified, the largest 1 containing biopsy clip artifact. No other abnormal appearing lymph nodes are identified. Ancillary findings:  None. IMPRESSION: 1. Two abnormal appearing LEFT axillary lymph nodes, 1 of which is biopsy-proven to contain metastatic disease. No suspicious MR findings within the LEFT breast. 2. Indeterminate 1 cm LOWER INNER RIGHT breast mass. 2nd-look ultrasound and possible biopsy is recommended. If this mass is not identified sonographically, than MR guided biopsy would be recommended. RECOMMENDATION: 1. 2nd-look RIGHT breast ultrasound with possible biopsy of the 1 cm LOWER INNER RIGHT breast mass. If this mass is not identified sonographically, then MR guided RIGHT breast biopsy is recommended. BI-RADS CATEGORY  4: Suspicious. Electronically Signed: By: [name] M.D. On: 10/09/2020 15:33     ELIGIBLE FOR AVAILABLE RESEARCH PROTOCOL: AET  ASSESSMENT: 45 y.o. [place], [place] woman status post left axillary lymph node biopsy 09/28/2020 for adenocarcinoma, grade not stated, estrogen and progesterone receptor positive, HER2 amplified, with an MIB-1 of 30%  (a) biopsy of a left  breast upper outer quadrant mass 09/28/2020 showed fibroadenoma  (b) biopsy of a left breast 0.6 cm mass pending  (c) biopsy of a right breast 1 cm mass pending  (1) neoadjuvant chemotherapy will consist of carboplatin and docetaxel every 21 days x 4 given concurrently with trastuzumab and Pertuzumab  (2) trastuzumab and pertuzumab to be continued to complete a year  (a) echo  (3) definitive surgery to follow  (4) adjuvant radiation as  (5) genetics testing  (6) antiestrogens  PLAN: I met today with Anita Mora to review her new diagnosis. Specifically we discussed the biology of her breast cancer, its diagnosis, staging, treatment  options and prognosis.We first reviewed the fact that cancer is not one disease but more than 100 different diseases and that it is important to keep them separate-- otherwise when friends and relatives discuss their own cancer experiences with Anita Mora confusion can result. Similarly we explained that if breast cancer spreads to the bone or liver, the patient would not have bone cancer or liver cancer, but breast cancer in the bone and breast cancer in the liver: one cancer in three places-- not 3 different cancers which otherwise would have to be treated in 3 different ways.  We discussed the difference between local and systemic therapy. In terms of loco-regional treatment, lumpectomy plus radiation is equivalent to mastectomy as far as survival is concerned. For this reason, and because the cosmetic results are generally superior, we recommend breast conserving surgery.   We also noted that in terms of sequencing of treatments, whether systemic therapy or surgery is done first does not affect the ultimate outcome.  This is relevant to  Anita Mora' treatment plans, since we believe she will benefit from neoadjuvant chemo.  This will allow [place] to assess response, and also give time for genetics testing so optimal surgical planning can be undertaken.  Next we went over the options  for systemic therapy which are anti-estrogens, anti-HER-2 immunotherapy, and chemotherapy. Anita Mora meets criteria for all 3 and we specifically discussed the current standard of care and situations like hers, namely docetaxel, carboplatin, trastuzumab, and pertuzumab.  We discussed the possible toxicities side effects and complications of these agents and she will also meet with our chemotherapy teaching nurse for further discussion.  The patient has a trip to [place] in [place] 10/12/2020 returning 10/16/2020.  This will push port placement, echo, and her biopsies back a little but we are hoping to be able to start  her chemotherapy 10/26/2020  Anita Mora has a good understanding of the overall plan. She agrees with it. She knows the goal of treatment in her case is cure. She will call with any problems that may develop before her next visit here.  Total encounter time 65 minutes.Anita Mora Jews C. Columbia Pandey, MD 10/11/2020 7:06 PM Medical Oncology and Hematology [facility] [place], [place] [address] Tel. [phone]    Fax. [phone]   This document serves as a record of services personally performed by Lurline Del, MD. It was created on his behalf by Wilburn Mylar, a trained medical scribe. The creation of this record is based on the scribe's personal observations and the provider's statements to them.   I, Lurline Del MD, have reviewed the above documentation for accuracy and completeness, and I agree with the above.    *Total Encounter Time as defined by the Centers for Medicare and Medicaid Services includes, in addition to the face-to-face time of a patient visit (documented in the note above) non-face-to-face time: obtaining and reviewing outside history, ordering and reviewing medications, tests or procedures, care coordination (communications with other health care professionals or caregivers) and documentation in the medical record.

## 2020-10-11 ENCOUNTER — Inpatient Hospital Stay (HOSPITAL_BASED_OUTPATIENT_CLINIC_OR_DEPARTMENT_OTHER): Payer: Managed Care, Other (non HMO) | Admitting: Oncology

## 2020-10-11 ENCOUNTER — Encounter: Payer: Self-pay | Admitting: *Deleted

## 2020-10-11 ENCOUNTER — Other Ambulatory Visit: Payer: Self-pay

## 2020-10-11 ENCOUNTER — Inpatient Hospital Stay: Payer: Managed Care, Other (non HMO)

## 2020-10-11 ENCOUNTER — Encounter: Payer: Self-pay | Admitting: Physical Therapy

## 2020-10-11 ENCOUNTER — Ambulatory Visit
Admission: RE | Admit: 2020-10-11 | Discharge: 2020-10-11 | Disposition: A | Discharge status: Home / Self Care | Payer: Managed Care, Other (non HMO) | Source: Ambulatory Visit | Attending: Radiation Oncology | Admitting: Radiation Oncology

## 2020-10-11 ENCOUNTER — Encounter: Payer: Self-pay | Admitting: Radiation Oncology

## 2020-10-11 ENCOUNTER — Ambulatory Visit (HOSPITAL_BASED_OUTPATIENT_CLINIC_OR_DEPARTMENT_OTHER): Payer: Managed Care, Other (non HMO) | Admitting: Genetic Counselor

## 2020-10-11 ENCOUNTER — Encounter: Payer: Self-pay | Admitting: Licensed Clinical Social Worker

## 2020-10-11 ENCOUNTER — Encounter: Payer: Self-pay | Admitting: Genetic Counselor

## 2020-10-11 ENCOUNTER — Other Ambulatory Visit: Payer: Self-pay | Admitting: *Deleted

## 2020-10-11 ENCOUNTER — Ambulatory Visit: Payer: Managed Care, Other (non HMO) | Attending: Surgery | Admitting: Physical Therapy

## 2020-10-11 ENCOUNTER — Ambulatory Visit: Payer: Self-pay | Admitting: Surgery

## 2020-10-11 VITALS — BP 121/72 | HR 83 | Temp 97.5°F | Resp 18 | Ht 68.0 in | Wt 192.6 lb

## 2020-10-11 DIAGNOSIS — Z800 Family history of malignant neoplasm of digestive organs: Secondary | ICD-10-CM

## 2020-10-11 DIAGNOSIS — Z803 Family history of malignant neoplasm of breast: Secondary | ICD-10-CM

## 2020-10-11 DIAGNOSIS — C50412 Malignant neoplasm of upper-outer quadrant of left female breast: Secondary | ICD-10-CM

## 2020-10-11 DIAGNOSIS — C773 Secondary and unspecified malignant neoplasm of axilla and upper limb lymph nodes: Secondary | ICD-10-CM

## 2020-10-11 DIAGNOSIS — R293 Abnormal posture: Secondary | ICD-10-CM | POA: Insufficient documentation

## 2020-10-11 DIAGNOSIS — Z791 Long term (current) use of non-steroidal anti-inflammatories (NSAID): Secondary | ICD-10-CM | POA: Insufficient documentation

## 2020-10-11 DIAGNOSIS — Z8249 Family history of ischemic heart disease and other diseases of the circulatory system: Secondary | ICD-10-CM

## 2020-10-11 DIAGNOSIS — C50919 Malignant neoplasm of unspecified site of unspecified female breast: Secondary | ICD-10-CM

## 2020-10-11 DIAGNOSIS — Z8042 Family history of malignant neoplasm of prostate: Secondary | ICD-10-CM | POA: Insufficient documentation

## 2020-10-11 DIAGNOSIS — Z806 Family history of leukemia: Secondary | ICD-10-CM

## 2020-10-11 DIAGNOSIS — Z7952 Long term (current) use of systemic steroids: Secondary | ICD-10-CM | POA: Insufficient documentation

## 2020-10-11 DIAGNOSIS — Z23 Encounter for immunization: Secondary | ICD-10-CM | POA: Insufficient documentation

## 2020-10-11 DIAGNOSIS — Z170 Estrogen receptor positive status [ER+]: Secondary | ICD-10-CM

## 2020-10-11 LAB — CBC WITH DIFFERENTIAL (CANCER CENTER ONLY)
Abs Immature Granulocytes: 0 10*3/uL (ref 0.00–0.07)
Basophils Absolute: 0 10*3/uL (ref 0.0–0.1)
Basophils Relative: 1 %
Eosinophils Absolute: 0.1 10*3/uL (ref 0.0–0.5)
Eosinophils Relative: 1 %
HCT: 34 % — ABNORMAL LOW (ref 36.0–46.0)
Hemoglobin: 11.3 g/dL — ABNORMAL LOW (ref 12.0–15.0)
Immature Granulocytes: 0 %
Lymphocytes Relative: 42 %
Lymphs Abs: 2 10*3/uL (ref 0.7–4.0)
MCH: 29.6 pg (ref 26.0–34.0)
MCHC: 33.2 g/dL (ref 30.0–36.0)
MCV: 89 fL (ref 80.0–100.0)
Monocytes Absolute: 0.4 10*3/uL (ref 0.1–1.0)
Monocytes Relative: 7 %
Neutro Abs: 2.4 10*3/uL (ref 1.7–7.7)
Neutrophils Relative %: 49 %
Platelet Count: 254 10*3/uL (ref 150–400)
RBC: 3.82 MIL/uL — ABNORMAL LOW (ref 3.87–5.11)
RDW: 13.2 % (ref 11.5–15.5)
WBC Count: 4.9 10*3/uL (ref 4.0–10.5)
nRBC: 0 % (ref 0.0–0.2)

## 2020-10-11 LAB — CMP (CANCER CENTER ONLY)
ALT: 12 U/L (ref 0–44)
AST: 19 U/L (ref 15–41)
Albumin: 4 g/dL (ref 3.5–5.0)
Alkaline Phosphatase: 47 U/L (ref 38–126)
Anion gap: 5 (ref 5–15)
BUN: 14 mg/dL (ref 6–20)
CO2: 29 mmol/L (ref 22–32)
Calcium: 9.6 mg/dL (ref 8.9–10.3)
Chloride: 103 mmol/L (ref 98–111)
Creatinine: 1.07 mg/dL — ABNORMAL HIGH (ref 0.44–1.00)
GFR, Estimated: >60 mL/min (ref >60)
Glucose, Bld: 96 mg/dL (ref 70–99)
Potassium: 4.5 mmol/L (ref 3.5–5.1)
Sodium: 137 mmol/L (ref 135–145)
Total Bilirubin: 0.7 mg/dL (ref 0.3–1.2)
Total Protein: 8.2 g/dL — ABNORMAL HIGH (ref 6.5–8.1)

## 2020-10-11 LAB — GENETIC SCREENING ORDER

## 2020-10-11 MED ORDER — DEXAMETHASONE 4 MG PO TABS: 8.0000 mg | ORAL_TABLET | Freq: Two times a day (BID) | ORAL | 1 refills | Status: DC

## 2020-10-11 MED ORDER — LIDOCAINE-PRILOCAINE 2.5-2.5 % EX CREA: TOPICAL_CREAM | CUTANEOUS | 3 refills | Status: DC

## 2020-10-11 MED ORDER — PROCHLORPERAZINE MALEATE 10 MG PO TABS: 10.0000 mg | ORAL_TABLET | Freq: Four times a day (QID) | ORAL | 1 refills | Status: DC | PRN

## 2020-10-11 MED ORDER — LORAZEPAM 0.5 MG PO TABS: 0.5000 mg | ORAL_TABLET | Freq: Every evening | ORAL | 0 refills | Status: DC | PRN

## 2020-10-11 NOTE — H&P (Signed)
Anita Mora Appointment: 10/11/2020 9:00 AM Location: [facility] Patient #: [id number] DOB: Jul 11, 1976 Undefined / Language: Anita Mora / Race: Black or African American Female  History of Present Illness Anita Mora A. Sylvan Sookdeo Mora; 10/11/2020 11:06 AM) Patient words: Pt seen in [place] for left axillary mass core bx left breast cancer metastatic to axillary LN Primary site yet to be identified  Undergoing MRI bx next week   Under workup to identify occult source at this point She has seen oncology and needs a port for chemotherapy. Mass was detected by U/S.       ADDITIONAL INFORMATION: 2. PROGNOSTIC INDICATORS Results: IMMUNOHISTOCHEMICAL AND MORPHOMETRIC ANALYSIS PERFORMED MANUALLY The tumor cells are POSITIVE for Her2 (3+). Estrogen Receptor: 50%, POSITIVE, MODERATE STAINING INTENSITY Progesterone Receptor: 80%, POSITIVE, MODERATE STAINING INTENSITY Proliferation Marker Ki67: 30% REFERENCE RANGE ESTROGEN RECEPTOR NEGATIVE 0% POSITIVE =>1% REFERENCE RANGE PROGESTERONE RECEPTOR NEGATIVE 0% POSITIVE =>1% All controls stained appropriately Anita Mora Pathologist, Electronic Signature ( Signed 10/03/2020) FINAL DIAGNOSIS Diagnosis 1. Breast, left, needle core biopsy, 3:00 - FIBROEPITHELIAL LESION. 1 of 3 Amended copy Addendum FINAL for [place], [place] [phone]) Diagnosis(continued) - NO INVASIVE CARCINOMA IDENTIFIED. - SEE MICROSCOPIC DESCRIPTION. 2. Lymph node, needle/core biopsy, left axillary adenopathy - LYMPH NODE WITH METASTATIC CARCINOMA. - SEE MICROSCOPIC DESCRIPTION. Microscopic Comment 1. The biopsy shows portions of fibroepithelial lesion and fibroadenoma is favored. No invasive carcinoma is identified in these biopsies. 2. The core biopsies show lymph nodal tissue with metastatic carcinoma. Immunohistochemistry will be performed and reported as an addendum. Dr. Tresa Moore agrees. Called to [facility] on 10/02/20. (JDP:kh  10/02/20) ADDENDUM: Immunohistochemistry shows the metastatic carcinoma is positive for estrogen receptor, progesterone receptor and GATA-3. GCDFP is negative. The immunophenotype is consistent with metastatic breast carcinoma. A breast prognostic profile will be performed and reported separately. (JDP:kh 10/02/20) Anita Mora Pathologist, Electronic Signature (Case signed 10/02/2020)        ADDENDUM REPORT: 10/11/2020 10:22 ADDENDUM: After further review, a 0.4 cm focus within the posterior central LEFT breast (series 12: Image 69) is identified. Given metastatic breast cancer within a LEFT axillary lymph node and no other suspicious abnormality within the LEFT breast, tissue sampling would be recommended to exclude malignancy. RECOMMENDATION: MR guided LEFT breast biopsy. BI-RADS category: 4: Suspicious. Dr. [name] is aware of this change. Electronically Signed By: Anita Canada M.D. On: 10/11/2020 10:22  Addended by Anita Canada, Mora on 10/11/2020 10:26 AM   Study Result  Narrative & Impression CLINICAL DATA: 45 year old female with recent diagnosis of metastatic carcinoma within a LEFT axillary lymph node. Recent biopsy of RETROAREOLAR LEFT breast mass demonstrating a fibroepithelial lesion favored to be a fibroadenoma. LABS: None performed today EXAM: BILATERAL BREAST MRI WITH AND WITHOUT CONTRAST TECHNIQUE: Multiplanar, multisequence MR images of both breasts were obtained prior to and following the intravenous administration of 8 ml of Gadavist Three-dimensional MR images were rendered by post-processing of the original MR data on an independent workstation. The three-dimensional MR images were interpreted, and findings are reported in the following complete MRI report for this study. Three dimensional images were evaluated at the independent interpreting workstation using the DynaCAD thin client. COMPARISON: Prior mammogram and ultrasounds from [facility]  FINDINGS: Breast composition: c. Heterogeneous fibroglandular tissue. Background parenchymal enhancement: Mild Right breast: A 1 cm irregular mass is identified within the posterior LOWER INNER RIGHT breast (series 12: Image 98). No other suspicious mass or worrisome enhancement is identified. Multiple benign cysts are present throughout the RIGHT breast. Left  breast: Biopsy clip artifact within the OUTER LEFT breast, middle depth noted. No suspicious abnormalities are identified within the LEFT breast. Multiple cysts are present throughout the LEFT breast. Lymph nodes: 2 abnormal appearing LEFT axillary lymph nodes are identified, the largest 1 containing biopsy clip artifact. No other abnormal appearing lymph nodes are identified. Ancillary findings: None. IMPRESSION: 1. Two abnormal appearing LEFT axillary lymph nodes, 1 of which is biopsy-proven to contain metastatic disease. No suspicious MR findings within the LEFT breast. 2. Indeterminate 1 cm LOWER INNER RIGHT breast mass. 2nd-look ultrasound and possible biopsy is recommended. If this mass is not identified sonographically, than MR guided biopsy would be recommended. RECOMMENDATION: 1. 2nd-look RIGHT breast ultrasound with possible biopsy of the 1 cm LOWER INNER RIGHT breast mass. If this mass is not identified sonographically, then MR guided RIGHT breast biopsy is recommended. BI-RADS CATEGORY 4: Suspicious. Electronically Signed: By: Anita Canada M.D. On: 10/09/2020 15:33.  The patient is a 45 year old female.   Medication History Anita Slipper, RN; 10/11/2020 8:20 AM) Medications Reconciled     Physical Exam (Anita Mora Anita Mora; 10/11/2020 11:09 AM)  General Mental Status-Alert. General Appearance-Consistent with stated age. Hydration-Well hydrated. Voice-Normal.  Chest and Lung Exam Chest and lung exam reveals -quiet, even and easy respiratory effort with no use of accessory muscles and on  auscultation, normal breath sounds, no adventitious sounds and normal vocal resonance. Inspection Chest Wall - Normal. Back - normal.  Breast Breast - Left-Symmetric, Non Tender, No Biopsy scars, no Dimpling - Left, No Inflammation, No Lumpectomy scars, No Mastectomy scars, No Peau d' Orange. Breast - Right-Symmetric, Non Tender, No Biopsy scars, no Dimpling - Right, No Inflammation, No Lumpectomy scars, No Mastectomy scars, No Peau d' Orange. Breast Lump-No Palpable Breast Mass.  Cardiovascular Cardiovascular examination reveals -normal heart sounds, regular rate and rhythm with no murmurs and normal pedal pulses bilaterally.  Neurologic Neurologic evaluation reveals -alert and oriented x 3 with no impairment of recent or remote memory. Mental Status-Normal.  Musculoskeletal Normal Exam - Left-Upper Extremity Strength Normal and Lower Extremity Strength Normal. Normal Exam - Right-Upper Extremity Strength Normal and Lower Extremity Strength Normal.  Lymphatic Axillary -Note:left axillary adenopathy 2 - 3 cm mobile smaller nodes noted right axilla normal.     Assessment & Plan (Emauri Krygier A. Flo Berroa Mora; 10/11/2020 11:11 AM)  BREAST CANCER METASTASIZED TO AXILLARY LYMPH NODE, LEFT (C50.912) Impression: finish work up port for chemotherapy surgery at a later time  Pt requires port placement for chemotherapy. Risk include bleeding, infection, pneumothorax, hemothorax, mediastinal injury, nerve injury , blood vessel injury, stroke, blood clots, death, migration. embolization and need for additional procedures. Pt agrees to proceed.  Current Plans Use of a central venous catheter for intravenous therapy was discussed. Technique of catheter placement using ultrasound and fluoroscopy guidance was discussed. Risks such as bleeding, infection, pneumothorax, catheter occlusion, reoperation, and other risks were discussed. I noted a good likelihood this will help address  the problem. Questions were answered. The patient expressed understanding & wishes to proceed. Pt Education - [employer] Free Text Education/Instructions: discussed with patient and provided information.

## 2020-10-11 NOTE — Progress Notes (Signed)
   Covid-19 Vaccination Clinic  Name:  TYNIA WIERS    MRN: [id number] DOB: December 11, 1975  10/11/2020  Ms. Andaya was observed post Covid-19 immunization for 15 minutes without incident. She was provided with Vaccine Information Sheet and instruction to access the V-Safe system.   Ms. Fiorillo was instructed to call 911 with any severe reactions post vaccine: [name] Kitchen Difficulty breathing  . Swelling of face and throat  . A fast heartbeat  . A bad rash all over body  . Dizziness and weakness

## 2020-10-11 NOTE — Progress Notes (Signed)
Radiation Oncology         [phone]) [phone] ________________________________  Initial Outpatient Consultation  Name: Anita Mora MRN: [id number]  Date: 10/11/2020  DOB: 17-Oct-1975  CC:Pcp, No  Erroll Luna, MD   REFERRING PHYSICIAN: Erroll Luna, MD  DIAGNOSIS: C77.3    ICD-10-CM   1. 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 female who presented with left breast abnormality on the following imaging: bilateral screening mammogram on the date of 08/21/2020. No symptoms were reported at that time. Ultrasound of breast on 09/18/2020 revealed three abnormal left axillary lymph nodes, at least two of which had lost their fatty hila and were suspicious for possible malignancy. There was also noted to be a 1.4 x 0.9 x 0.7 cm irregular mass in the left breast at the 3 o'clock position along the areolar margin that was suspicious for malignancy. Finally, there was a 1.7 cm oval simple cyst in the left breast at the 2 o'clock position at middle depth that was benign.   Biopsy on the date of 09/28/2020 showed metastatic carcinoma of the left axillary lymph node. ER status: 50% moderate; PR status: 80% moderate; Her2 status: positive; Grade not provided thus far by pathology. Left breast mass at the 3 o'clock position showed fibroepithelial lesion without invasive carcinoma.  MRI of bilateral breasts on 10/09/2020 showed two abnormal appearing left axillary lymph nodes, one of which was biopsy-proven to contain metastatic disease. There was also noted  to be an indeterminate 1 cm lower inner right breast mass. Second-look ultrasound and possible biopsy was recommended.  In conference this morning radiology also mentioned that there is a 6 mm oval lesion in the left breast that warrants biopsy.  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  She has received COVID vaccinations x2 and is due for her booster.  She denies previous radiation therapy.  She is interested in having a patient mentor.  PREVIOUS RADIATION THERAPY: No  PAST MEDICAL HISTORY:  has a past medical history of Family history of breast cancer, Family history of colon cancer, Family history of leukemia, Family history of prostate cancer, Hemorrhoids, and Seasonal allergies.    PAST SURGICAL HISTORY: Past Surgical History:  Procedure Laterality Date  . HEMORROIDECTOMY      FAMILY HISTORY: family history includes Aneurysm (age of onset: 46) in her maternal grandfather; Breast cancer (age of onset: 31) in her mother; Colon cancer in her maternal grandmother; Hypertension in her mother; Leukemia (age of onset: 14) in an other family member; Prostate cancer in her maternal uncle, paternal uncle, and paternal uncle; Prostate cancer (age of onset: 71) in her maternal uncle; Stroke in her paternal grandmother.  SOCIAL HISTORY:  reports that she has never smoked. She does not have any smokeless tobacco history on file. She reports current alcohol use. She reports that she does not use drugs.  ALLERGIES: Patient has no known allergies.  MEDICATIONS:  Current Outpatient Medications  Medication Sig Dispense Refill  . meloxicam (MOBIC) 15 MG tablet One tab PO qAM with breakfast  for 2 weeks, then daily prn pain. 30 tablet 3  . MULTIPLE VITAMIN PO Take by mouth.     No current facility-administered medications for this encounter.    REVIEW OF SYSTEMS:  As above   PHYSICAL EXAM:  vitals were not taken for this visit.   General: Alert and oriented, in no acute distress HEENT: Head is  normocephalic.   Heart: Regular in rate and rhythm with no murmurs, rubs, or gallops. Chest: Clear to auscultation bilaterally, with no rhonchi, wheezes, or rales. Psychiatric: Judgment and insight are intact. Affect is appropriate. Breasts: No palpable masses in her breasts bilaterally.  There is a 2 cm node palpable in the left axilla.  ECOG = 0  0 - Asymptomatic (Fully active, able to carry on all predisease activities without restriction)  1 - Symptomatic but completely ambulatory (Restricted in physically strenuous activity but ambulatory and able to carry out work of a light or sedentary nature. For example, light housework, office work)  2 - Symptomatic, <50% in bed during the day (Ambulatory and capable of all self care but unable to carry out any work activities. Up and about more than 50% of waking hours)  3 - Symptomatic, >50% in bed, but not bedbound (Capable of only limited self-care, confined to bed or chair 50% or more of waking hours)  4 - Bedbound (Completely disabled. Cannot carry on any self-care. Totally confined to bed or chair)  5 - Death   Eustace Pen MM, Creech RH, Tormey DC, et al. [phone]). "Toxicity and response criteria of the [facility]". [place] Oncol. 5 (6): 649-55   LABORATORY DATA:  Lab Results  Component Value Date   WBC 4.9 10/11/2020   HGB 11.3 (L) 10/11/2020   HCT 34.0 (L) 10/11/2020   MCV 89.0 10/11/2020   PLT 254 10/11/2020   CMP     Component Value Date/Time   NA 137 10/11/2020 0845   K 4.5 10/11/2020 0845   CL 103 10/11/2020 0845   CO2 29 10/11/2020 0845   GLUCOSE 96 10/11/2020 0845   BUN 14 10/11/2020 0845   CREATININE 1.07 (H) 10/11/2020 0845   CALCIUM 9.6 10/11/2020 0845   PROT 8.2 (H) 10/11/2020 0845   ALBUMIN 4.0 10/11/2020 0845   AST 19 10/11/2020 0845   ALT 12 10/11/2020 0845   ALKPHOS 47 10/11/2020 0845   BILITOT 0.7 10/11/2020 0845   GFRNONAA >60 10/11/2020 0845         RADIOGRAPHY: MR BREAST  BILATERAL W WO CONTRAST INC CAD  Addendum Date: 10/11/2020   ADDENDUM REPORT: 10/11/2020 10:22 ADDENDUM: After further review, a 0.4 cm focus within the posterior central LEFT breast (series 12: Image 69) is identified. Given metastatic breast cancer within a LEFT axillary lymph node and no other suspicious abnormality within the LEFT breast, tissue sampling would be recommended to exclude malignancy. RECOMMENDATION: MR guided LEFT breast biopsy. BI-RADS category:  4: Suspicious. Dr. [name] is aware of this change. Electronically Signed   By: [name] M.D.   On: 10/11/2020 10:22   Result Date: 10/11/2020 CLINICAL DATA:  45 year old female with recent diagnosis of metastatic carcinoma within a LEFT axillary lymph node. Recent biopsy of RETROAREOLAR LEFT breast mass demonstrating a fibroepithelial lesion favored to be a fibroadenoma. LABS:  None performed today EXAM: BILATERAL BREAST MRI WITH AND WITHOUT CONTRAST TECHNIQUE: Multiplanar, multisequence MR images of both breasts were obtained prior to and following the intravenous administration of 8 ml of Gadavist Three-dimensional  MR images were rendered by post-processing of the original MR data on an independent workstation. The three-dimensional MR images were interpreted, and findings are reported in the following complete MRI report for this study. Three dimensional images were evaluated at the independent interpreting workstation using the DynaCAD thin client. COMPARISON:  Prior mammogram and ultrasounds from [facility] FINDINGS: Breast composition: c. Heterogeneous fibroglandular tissue. Background parenchymal enhancement: Mild Right breast: A 1 cm irregular mass is identified within the posterior LOWER INNER RIGHT breast (series 12: Image 98). No other suspicious mass or worrisome enhancement is identified. Multiple benign cysts are present throughout the RIGHT breast. Left breast: Biopsy clip artifact within the OUTER LEFT breast, middle depth noted. No  suspicious abnormalities are identified within the LEFT breast. Multiple cysts are present throughout the LEFT breast. Lymph nodes: 2 abnormal appearing LEFT axillary lymph nodes are identified, the largest 1 containing biopsy clip artifact. No other abnormal appearing lymph nodes are identified. Ancillary findings:  None. IMPRESSION: 1. Two abnormal appearing LEFT axillary lymph nodes, 1 of which is biopsy-proven to contain metastatic disease. No suspicious MR findings within the LEFT breast. 2. Indeterminate 1 cm LOWER INNER RIGHT breast mass. 2nd-look ultrasound and possible biopsy is recommended. If this mass is not identified sonographically, than MR guided biopsy would be recommended. RECOMMENDATION: 1. 2nd-look RIGHT breast ultrasound with possible biopsy of the 1 cm LOWER INNER RIGHT breast mass. If this mass is not identified sonographically, then MR guided RIGHT breast biopsy is recommended. BI-RADS CATEGORY  4: Suspicious. Electronically Signed: By: [name] M.D. On: 10/09/2020 15:33      IMPRESSION/PLAN: This is a lovely 45 year old woman with a new diagnosis of axillary adenopathy consistent with left breast primary    The patient was discussed at breast clinic.  She understands she has some pending breast biopsies based on MRI findings.  The consensus is that she would benefit from neoadjuvant chemotherapy, followed by breast conserving surgery, followed by adjuvant radiation therapy.  This would be followed by antiestrogen therapy.  She will be referral to genetic counseling as well.  Staging scans are pending.  It was a pleasure meeting the patient today. We discussed the risks, benefits, and side effects of radiotherapy. I recommend 5-6 weeks of radiotherapy to the left breast and regional nodes to reduce her risk of locoregional recurrence by 2/3.  If she is determined to have a right breast cancer as well then she would receive radiation therapy to the right breast to after breast  conserving surgery.  She understands that if ultimately we only find disease in her left axilla and no disease in her breasts, prophylactic radiation would still be delivered to the left breast itself.   We spoke about acute effects including skin irritation and fatigue as well as much less common late effects including internal organ injury or irritation. We spoke about the latest technology that is used to minimize the risk of late effects for patients undergoing radiotherapy to the breast or chest wall. No guarantees of treatment were given. The patient is enthusiastic about proceeding with treatment. I look forward to participating in the patient's care.  I will await her referral back to me for postoperative follow-up and eventual CT simulation/treatment planning.  We discussed measures to reduce the risk of infection during the COVID-19 pandemic.  She is due for her booster shot.  I spoke with her about why I highly recommend she receive the booster shot.  She does not have her vaccination cards  with her today and therefore our nurse navigator will talk to her about scheduling an appointment in the near future.  On date of service, in total, I spent 40 minutes on this encounter. Patient was seen in person.   __________________________________________   Eppie Gibson, MD  This document serves as a record of services personally performed by Eppie Gibson, MD. It was created on his behalf by Clerance Lav, a trained medical scribe. The creation of this record is based on the scribe's personal observations and the provider's statements to them. This document has been checked and approved by the attending provider.

## 2020-10-11 NOTE — Therapy (Signed)
Anita Mora, [place], [address] Phone: [phone]   Fax:  [phone]  Physical Therapy Evaluation  Patient Details  Name: Anita Mora MRN: [id number] Date of Birth: 1976/07/01 Referring Provider (PT): Dr. Erroll Luna   Encounter Date: 10/11/2020   PT End of Session - 10/11/20 1212    Visit Number 1    Number of Visits 2    Date for PT Re-Evaluation 04/10/21    PT Start Time 1059    PT Stop Time 1130    PT Time Calculation (min) 31 min    Activity Tolerance Patient tolerated treatment well    Behavior During Therapy [facility] for tasks assessed/performed           Past Medical History:  Diagnosis Date  . Hemorrhoids   . Seasonal allergies     Past Surgical History:  Procedure Laterality Date  . HEMORROIDECTOMY      There were no vitals filed for this visit.    Subjective Assessment - 10/11/20 1140    Subjective Patient reports she is here today to be seen by her medical team for her newly diagnosed left breast cancer.    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 She has a left breast 6 mm area that needs to be biopsied but a 2.3 cm positive axillary lymph node. Ki67 is 30%.    Patient Stated Goals Reduce lymphedema risk and learn post op shoulder ROM HEP    Currently in Pain? No/denies              Anita Mora PT Assessment - 10/11/20 0001      Assessment   Medical Diagnosis Left breast cancer    Referring Provider (PT) Dr. [name] [place]    Onset Date/Surgical Date 08/21/20    Hand Dominance Right    Prior Therapy none      Precautions   Precautions Other (comment)    Precaution Comments active cancer      Restrictions   Weight Bearing Restrictions No      Balance Screen   Has Anita patient fallen in Anita past 6 months No    Has Anita patient had a decrease  in activity level because of a fear of falling?  No    Is Anita patient reluctant to leave their home because of a fear of falling?  No      Home Social worker Private residence    Living Arrangements Spouse/significant other;Children   Husband, 67, and 60 y.o. kids; 87 y.o. 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    Leisure She does bootcamp 5x/week      Cognition   Overall Cognitive Status Within Functional Limits for tasks assessed      Posture/Postural Control   Posture/Postural Control Postural limitations    Postural Limitations Rounded Shoulders;Forward head      ROM / Strength   AROM / PROM / Strength AROM;Strength      AROM   Overall AROM Comments Cervical AROM is WNL    AROM Assessment Site Shoulder    Right/Left Shoulder Right;Left    Right Shoulder Extension 65 Degrees    Right Shoulder Flexion  157 Degrees    Right Shoulder ABduction 163 Degrees    Right Shoulder Internal Rotation 56 Degrees    Right Shoulder External Rotation 86 Degrees    Left Shoulder Extension 61 Degrees    Left Shoulder Flexion 153 Degrees    Left Shoulder ABduction 170 Degrees    Left Shoulder Internal Rotation 79 Degrees    Left Shoulder External Rotation 90 Degrees      Strength   Overall Strength Within functional limits for tasks performed             LYMPHEDEMA/ONCOLOGY QUESTIONNAIRE - 10/11/20 0001      Type   Cancer Type Left breast cancer      Lymphedema Assessments   Lymphedema Assessments Upper extremities      Right Upper Extremity Lymphedema   10 cm Proximal to Olecranon Process 31.6 cm    Olecranon Process 27 cm    10 cm Proximal to Ulnar Styloid Process 23.4 cm    Just Proximal to Ulnar Styloid Process 16.7 cm    Across Hand at Anita Mora 20.3 cm    At Anita Mora of 2nd Digit 6.2 cm      Left  Upper Extremity Lymphedema   10 cm Proximal to Olecranon Process 31.5 cm    Olecranon Process 27.3 cm    10 cm Proximal to Ulnar Styloid Process 22 cm    Just Proximal to Ulnar Styloid Process 15.9 cm    Across Hand at Anita Mora 20.2 cm    At [place] of 2nd Digit 6.1 cm           L-DEX FLOWSHEETS - 10/11/20 1100      L-DEX LYMPHEDEMA SCREENING   Measurement Type Unilateral    L-DEX MEASUREMENT EXTREMITY Upper Extremity    POSITION  Standing    DOMINANT SIDE Right    At Risk Side Left    BASELINE SCORE (UNILATERAL) -1.5           Anita patient was assessed using Anita L-Dex machine today to produce a lymphedema index baseline score. Anita patient will be reassessed on a regular basis (typically every 3 months) to obtain new L-Dex scores. If Anita score is > 6.5 points away from his/her baseline score indicating onset of subclinical lymphedema, it will be recommended to wear a compression garment for 4 weeks, 12 hours per day and then be reassessed. If Anita score continues to be > 6.5 points from baseline at reassessment, we will initiate lymphedema treatment. Assessing in this manner has a 95% rate of preventing clinically significant lymphedema.      Anita Mora - 10/11/20 0001    Open a tight or new jar No difficulty    Do heavy household chores (wash walls, wash floors) No difficulty    Carry a shopping bag or briefcase No difficulty    Wash your back No difficulty    Use a knife to cut food No difficulty    Recreational activities in which you take some force or impact through your arm, shoulder, or hand (golf, hammering, tennis) No difficulty    During Anita past week, to what extent has your arm, shoulder or hand problem interfered with your normal social activities with family, friends, neighbors, or groups? Not at all    During Anita past week, to what extent has your arm, shoulder or hand problem limited your work or other regular daily activities Not at all    Arm, shoulder, or  hand  pain. None    Tingling (pins and needles) in your arm, shoulder, or hand None    Difficulty Sleeping No difficulty    DASH Score 0 %            Objective measurements completed on examination: See above findings.         Patient was instructed today in a home exercise program today for post op shoulder range of motion. These included active assist shoulder flexion in sitting, scapular retraction, wall walking with shoulder abduction, and hands behind head external rotation.  She was encouraged to do these twice a day, holding 3 seconds and repeating 5 times when permitted by her physician.          PT Education - 10/11/20 1209    Education Details Lymphedema risk reduction and post op shoulder ROM HEP    Person(s) Educated Patient;Caregiver(s)   sister   Methods Explanation;Demonstration;Handout    Comprehension Returned demonstration;Verbalized understanding               PT Long Term Goals - 10/11/20 1218      PT LONG TERM GOAL #1   Title Patient will demostrate she has regained full shoulder ROM and function post operatively compared to bsaelines.    Time 6    Period Months    Status New    Target Date 04/10/21           Breast Clinic Goals - 10/11/20 1218      Patient will be able to verbalize understanding of pertinent lymphedema risk reduction practices relevant to her diagnosis specifically related to skin care.   Time 1    Period Days    Status Achieved      Patient will be able to return demonstrate and/or verbalize understanding of Anita post-op home exercise program related to regaining shoulder range of motion.   Time 1    Period Days    Status Achieved      Patient will be able to verbalize understanding of Anita importance of attending Anita postoperative After Breast Cancer Class for further lymphedema risk reduction education and therapeutic exercise.   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 She has a left breast 6 mm area that needs to be biopsied but a 2.3 cm positive axillary lymph node. Ki67 is 30%. Her multidisciplinary medical team met prior to her assessments to determine a recommended treatment plan. She is planning to have neoadjuvant chemotherapy followed by a left breast surgery and targeted node dissection, radiation, and anti-estrogen therapy. She will benefit from a post op PT reassessment and L-Dex screens every 3 months to detect subclinical lymphedema.    Stability/Clinical Decision Making Stable/Uncomplicated    Clinical Decision Making Low    Rehab Potential Excellent    PT Frequency --   Eval and 1 f/u visit   PT Treatment/Interventions ADLs/Self Care Home Management;Therapeutic exercise;Patient/family education    PT Next Visit Plan Will reassess 3-4 weeks post op    PT Home Exercise Plan Post op shoulder ROM HEP    Consulted and Agree with Plan of Care Patient;Family member/caregiver    Family Member Consulted sister           Patient will  benefit from skilled therapeutic intervention in order to improve Anita following deficits and impairments:  Postural dysfunction,Decreased knowledge of precautions,Decreased range of motion,Impaired UE functional use,Pain  Visit Diagnosis: 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 Left ankle sprain 02/23/2014   Annia Friendly, PT 10/11/20 12:22 PM  [place], [place], [address] Phone: [phone]   Fax:   [phone]  Name: Anita Mora MRN: [id number] Date of Birth: May 30, 1976

## 2020-10-11 NOTE — Progress Notes (Signed)
[place] Work  Initial Assessment   Anita Mora is a 45 y.o. year old female accompanied by patient and sister, Anita Mora. Clinical Social Work was referred by [facility] for assessment of psychosocial needs.   SDOH (Social Determinants of Health) assessments performed: Yes SDOH Interventions   Flowsheet Row Most Recent Value  SDOH Interventions   Food Insecurity Interventions Intervention Not Indicated  Financial Strain Interventions Intervention Not Indicated  Housing Interventions Intervention Not Indicated  Transportation Interventions Intervention Not Indicated      Distress Screen completed: Yes ONCBCN DISTRESS SCREENING 10/11/2020  Screening Type Initial Screening  Distress experienced in past week (1-10) 1  Emotional problem type Adjusting to illness  Information Concerns Type Lack of info about diagnosis;Lack of info about treatment      Family/Social Information:  . Housing Arrangement: patient lives with husband Anita Mora), daughter Anita Mora- 83), son Anita Mora- 56). Oldest child- Anita Mora, 88- in college. New house being built by end of year . Family members/support persons in your life? Family- sister, husband, mom; Friends and Medical laboratory scientific officer . Transportation concerns: no  . 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 Also working on [employer].  Income source: Employment . Financial concerns: No o Type of concern: None . Food access concerns: no . Religious or spiritual practice: yes, strong faith . Medication Concerns: no  . Services Currently in place:  n/a  Coping/ Adjustment to diagnosis: . Patient understands treatment plan and what happens next? yes . Concerns about diagnosis and/or treatment: Overwhelmed by information . Patient reported stressors: Adjusting to my illness. 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 Hopes and  priorities: be able to continue working and be well again . Patient enjoys exercise, time with family/ friends and sports, faith . Current coping skills/ strengths: Average or above average intelligence, Capable of independent living, Communication skills, Financial means, Motivation for treatment/growth, Religious Affiliation and Supportive family/friends    SUMMARY: Current SDOH Barriers:  . No significant SDOH barriers noted today  Clinical Social Work Clinical Goal(s):  [name] Kitchen No clinical SW goals at this time  Interventions: . Discussed common feeling and emotions when being diagnosed with cancer, and the importance of support during treatment . Informed patient of the support team roles and support services at [facility] . Provided CSW contact information and encouraged patient to call with any questions or concerns   Follow Up Plan: Patient will contact CSW with any support or resource needs Patient verbalizes understanding of plan: Yes    Christeen Douglas , LCSW

## 2020-10-11 NOTE — Progress Notes (Signed)
START ON PATHWAY REGIMEN - Breast     A cycle is every 21 days:     Pertuzumab      Pertuzumab      Trastuzumab-xxxx      Trastuzumab-xxxx      Carboplatin      Docetaxel   **Always confirm dose/schedule in your pharmacy ordering system**  Patient Characteristics: Preoperative or Nonsurgical Candidate (Clinical Staging), Neoadjuvant Therapy followed by Surgery, Invasive Disease, Chemotherapy, HER2 Positive, ER Positive Therapeutic Status: Preoperative or Nonsurgical Candidate (Clinical Staging) AJCC M Category: cM0 AJCC Grade: G2 Breast Surgical Plan: Neoadjuvant Therapy followed by Surgery ER Status: Positive (+) AJCC 8 Stage Grouping: IB HER2 Status: Positive (+) AJCC T Category: cT1b AJCC N Category: cN1 PR Status: Positive (+) Intent of Therapy: Curative Intent, Discussed with Patient

## 2020-10-11 NOTE — Progress Notes (Signed)
REFERRING PROVIDER: Chauncey Cruel, MD [place],  [place] [address]  PRIMARY PROVIDER:  Pcp, No  PRIMARY REASON FOR VISIT:  1. 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 Family history of breast cancer   3. Family history of prostate cancer   4. Family history of colon cancer   5. Family history of leukemia      I connected with Anita Mora on 10/11/2020 at 12:00 pm EDT by video conference and verified that I am speaking with the correct person using two identifiers.   Patient location: [facility] clinic Provider location: [facility] office  HISTORY OF PRESENT ILLNESS:   Anita Mora, a 45 y.o. female, was seen for a [place] cancer genetics consultation at the request of Dr. Jana Hakim due to a personal and family history of cancer.  Anita Mora presents to clinic today to discuss the possibility of a hereditary predisposition to cancer, genetic testing, and to further clarify her future cancer risks, as well as potential cancer risks for family members.   On 09/28/2020, at the age of 45, Anita Mora was diagnosed with breast cancer metastasized to left axillary lymph node. The biopsy was ER+/PR+/Her2+. The treatment plan includes neoadjuvant chemotherapy and then surgery.   RISK FACTORS:   Menarche was at age 59.  First live birth at age 1.  OCP use for more than 2 years.  Ovaries intact: yes.  Hysterectomy: no.  Menopausal status: premenopausal.  HRT use: 0 years. Colonoscopy: no. Mammogram within the last year: yes.   Past Medical History:  Diagnosis Date  . Family history of breast cancer   . Family history of colon cancer   . Family history of leukemia   . Family history of prostate cancer   . Hemorrhoids   . Seasonal allergies     Past Surgical History:  Procedure Laterality Date  . HEMORROIDECTOMY      Social History   Socioeconomic History  . Marital status: Married    Spouse name: Not on file  . Number  of children: Not on file  . Years of education: Not on file  . Highest education level: Not on file  Occupational History  . Not on file  Tobacco Use  . Smoking status: Never Smoker  . Smokeless tobacco: Not on file  Substance and Sexual Activity  . Alcohol use: Yes    Comment: occassional  . Drug use: No  . Sexual activity: Not on file  Other Topics Concern  . Not on file  Social History Narrative  . Not on file   Social Determinants of Health   Financial Resource Strain: Low Risk   . Difficulty of Paying Living Expenses: Not hard at all  Food Insecurity: No Food Insecurity  . Worried About Charity fundraiser in the Last Year: Never true  . Ran Out of Food in the Last Year: Never true  Transportation Needs: No Transportation Needs  . Lack of Transportation (Medical): No  . Lack of Transportation (Non-Medical): No  Physical Activity: Not on file  Stress: Not on file  Social Connections: Not on file     FAMILY HISTORY:  We obtained a detailed, 4-generation family history.  Significant diagnoses are listed below: Family History  Problem Relation Age of Onset  . Hypertension Mother   . Breast cancer Mother 74       gene testing  - neg at [facility]  . Colon cancer Maternal Grandmother  dx 44s  . Prostate cancer Paternal Uncle        dx 45s  . Prostate cancer Maternal Uncle        60's  . Aneurysm Maternal Grandfather 57  . Stroke Paternal Grandmother   . Leukemia Other 10       Paternal cousin's son  . Prostate cancer Maternal Uncle 80  . Prostate cancer Paternal Uncle        dx 64s   Anita Mora has two daughters and one son (ages 44-18). She has one brother (age 15) and one sister (age 22). None of these relatives have had cancer.  Anita Mora's mother is 69 and had breast cancer diagnosed at age 82. She had genetic testing through Dr. Joesph July at [facility] (record not available for review). Anita Mora had eight maternal uncles and two maternal aunts. Two  uncles had prostate cancer - one diagnosed in his 31s and the other diagnosed at age 3. One maternal first cousin was also diagnosed with prostate cancer at age 13. Anita Mora's maternal grandmother died at age 46 from colon cancer (diagnosed in her 77s). Anita Mora maternal grandfather died at age 61 from a brain aneurysm.   Anita Mora father is 39 and has not had cancer. She has two paternal aunts and two paternal uncles. Both uncles were diagnosed with prostate cancer in their 8s. One of these uncles had a grandson who died from leukemia at age 52. Anita Mora's paternal grandmother died in her 38s from a stroke, and her paternal grandfather died in his 75s or 19s.  Anita Mora is aware of previous family history of genetic testing for hereditary cancer risks. Patient's maternal ancestors are of [place] American and Native American descent, and paternal ancestors are of [place] American and Native American descent. There is no reported Ashkenazi Jewish ancestry. There is no known consanguinity.  GENETIC COUNSELING ASSESSMENT: Anita Mora is a 45 y.o. female with a personal history of young-onset breast cancer and a family history of breast cancer, prostate cancer, and colon cancer, which is somewhat suggestive of a hereditary cancer syndrome and predisposition to cancer. We, therefore, discussed and recommended the following at today's visit.   DISCUSSION: We discussed that approximately 5-10% of breast cancer is hereditary, with most cases associated with the BRCA1 and BRCA2 genes. There are other genes that can be associated with hereditary breast cancer syndromes. These include ATM, CHEK2, PALB2, etc. We discussed that testing is beneficial for several reasons, including knowing about other cancer risks, identifying potential screening and risk-reduction options that may be appropriate, and to understand if other family members could be at risk for cancer and allow them to undergo  genetic testing.   We reviewed the characteristics, features and inheritance patterns of hereditary cancer syndromes. We also discussed genetic testing, including the appropriate family members to test, the process of testing, insurance coverage and turn-around-time for results. We discussed the implications of a negative, positive and/or variant of uncertain significant result. We recommended Anita Mora pursue genetic testing for the [facility] CustomNext-Cancer + RNAinsight gene panel.   The CustomNext-Cancer+RNAinsight panel offered by [facility] includes sequencing and rearrangement analysis for the following 47 genes:  APC, ATM, AXIN2, BARD1, BMPR1A, BRCA1, BRCA2, BRIP1, CDH1, CDK4, CDKN2A, CHEK2, DICER1, EPCAM, GREM1, HOXB13, MEN1, MLH1, MSH2, MSH3, MSH6, MUTYH, NBN, NF1, NF2, NTHL1, PALB2, PMS2, POLD1, POLE, PTEN, RAD51C, RAD51D, RECQL, RET, SDHA, SDHAF2, SDHB, SDHC, SDHD, SMAD4, SMARCA4, STK11, TP53, TSC1, TSC2, and VHL.  RNA data  is routinely analyzed for use in variant interpretation for all genes.  Based on Anita Mora's personal and family history of cancer, she meets medical criteria for genetic testing. Despite that she meets criteria, there may still be an out of pocket cost.   PLAN: After considering the risks, benefits, and limitations, Anita Mora provided informed consent to pursue genetic testing and the blood sample was sent to [facility] for analysis of the CustomNext-Cancer+RNAinsight. Results should be available within approximately two-three weeks' time, at which point they will be disclosed by telephone to Anita Mora, as will any additional recommendations warranted by these results. Anita Mora will receive a summary of her genetic counseling visit and a copy of her results once available. This information will also be available in Epic.   Anita Mora's questions were answered to her satisfaction today. Our contact information was provided should additional  questions or concerns arise. Thank you for the referral and allowing [place] to share in the care of your patient.   Clint Guy, [place], [facility] Licensed, Certified Dispensing [email or web address] Phone: [phone]  The patient was seen for a total of 25 minutes in face-to-face genetic counseling.  This patient was discussed with Drs. Magrinat, Lindi Adie and/or Burr Medico who agrees with the above.    _______________________________________________________________________ For Office Staff:  Number of people involved in session: 1 Was an Intern/ student involved with case: no

## 2020-10-11 NOTE — Patient Instructions (Signed)

## 2020-10-12 ENCOUNTER — Telehealth: Payer: Self-pay | Admitting: Oncology

## 2020-10-12 NOTE — Telephone Encounter (Signed)
Scheduled appts per 1/12 los. Left voicemail with appt dates and times.

## 2020-10-13 ENCOUNTER — Other Ambulatory Visit: Payer: Self-pay | Admitting: Oncology

## 2020-10-13 DIAGNOSIS — C773 Secondary and unspecified malignant neoplasm of axilla and upper limb lymph nodes: Secondary | ICD-10-CM

## 2020-10-13 DIAGNOSIS — C50919 Malignant neoplasm of unspecified site of unspecified female breast: Secondary | ICD-10-CM

## 2020-10-17 ENCOUNTER — Ambulatory Visit (HOSPITAL_COMMUNITY)
Admission: RE | Admit: 2020-10-17 | Discharge: 2020-10-17 | Disposition: A | Discharge status: Home / Self Care | Payer: Managed Care, Other (non HMO) | Source: Ambulatory Visit | Attending: Oncology | Admitting: Oncology

## 2020-10-17 ENCOUNTER — Other Ambulatory Visit: Payer: Self-pay

## 2020-10-17 ENCOUNTER — Encounter (HOSPITAL_COMMUNITY)
Admission: RE | Admit: 2020-10-17 | Discharge: 2020-10-17 | Disposition: A | Discharge status: Home / Self Care | Payer: Managed Care, Other (non HMO) | Source: Ambulatory Visit | Attending: Oncology | Admitting: Oncology

## 2020-10-17 DIAGNOSIS — R937 Abnormal findings on diagnostic imaging of other parts of musculoskeletal system: Secondary | ICD-10-CM | POA: Insufficient documentation

## 2020-10-17 DIAGNOSIS — C50919 Malignant neoplasm of unspecified site of unspecified female breast: Secondary | ICD-10-CM | POA: Diagnosis not present

## 2020-10-17 DIAGNOSIS — C773 Secondary and unspecified malignant neoplasm of axilla and upper limb lymph nodes: Secondary | ICD-10-CM | POA: Insufficient documentation

## 2020-10-17 MED ORDER — TECHNETIUM TC 99M MEDRONATE IV KIT
20.0000 | PACK | Freq: Once | INTRAVENOUS | Status: AC | PRN
  Administered 2020-10-17: 20 via INTRAVENOUS

## 2020-10-17 NOTE — Progress Notes (Signed)
.  The following biosimilar Kanjinti (trastuzumab-anns) has been selected for use in this patient due to insurance requirements.  .The following biosimilar Ziextenzo (pegfilgrastim-bmez) has been selected for use in this patient due to insurance for 1 dose only then will need to go to specialty pharmacy.  Henreitta Leber, PharmD

## 2020-10-18 ENCOUNTER — Encounter: Payer: Self-pay | Admitting: *Deleted

## 2020-10-18 ENCOUNTER — Encounter (HOSPITAL_BASED_OUTPATIENT_CLINIC_OR_DEPARTMENT_OTHER): Payer: Self-pay | Admitting: Surgery

## 2020-10-18 ENCOUNTER — Other Ambulatory Visit: Payer: Self-pay

## 2020-10-19 ENCOUNTER — Other Ambulatory Visit: Payer: Self-pay | Admitting: *Deleted

## 2020-10-19 ENCOUNTER — Ambulatory Visit (HOSPITAL_COMMUNITY)
Admission: RE | Admit: 2020-10-19 | Discharge: 2020-10-19 | Disposition: A | Discharge status: Home / Self Care | Payer: Managed Care, Other (non HMO) | Source: Ambulatory Visit | Attending: Oncology | Admitting: Oncology

## 2020-10-19 ENCOUNTER — Inpatient Hospital Stay: Payer: Managed Care, Other (non HMO)

## 2020-10-19 ENCOUNTER — Telehealth: Payer: Self-pay | Admitting: *Deleted

## 2020-10-19 ENCOUNTER — Other Ambulatory Visit: Payer: Self-pay

## 2020-10-19 DIAGNOSIS — C50919 Malignant neoplasm of unspecified site of unspecified female breast: Secondary | ICD-10-CM | POA: Insufficient documentation

## 2020-10-19 DIAGNOSIS — C773 Secondary and unspecified malignant neoplasm of axilla and upper limb lymph nodes: Secondary | ICD-10-CM | POA: Insufficient documentation

## 2020-10-19 DIAGNOSIS — R590 Localized enlarged lymph nodes: Secondary | ICD-10-CM | POA: Diagnosis present

## 2020-10-19 DIAGNOSIS — Z0189 Encounter for other specified special examinations: Secondary | ICD-10-CM

## 2020-10-19 LAB — ECHOCARDIOGRAM COMPLETE
Area-P 1/2: 3.31 cm2
S' Lateral: 2.7 cm

## 2020-10-19 MED ORDER — PEGFILGRASTIM-BMEZ 6 MG/0.6ML ~~LOC~~ SOSY: 6.0000 mg | PREFILLED_SYRINGE | Freq: Once | SUBCUTANEOUS | 6 refills | Status: AC

## 2020-10-19 NOTE — Progress Notes (Signed)
  Echocardiogram 2D Echocardiogram has been performed.  Jennette Dubin 10/19/2020, 10:20 AM

## 2020-10-19 NOTE — Progress Notes (Signed)
Pharmacist Chemotherapy Monitoring - Initial Assessment    Anticipated start date: 10/26/2020    Regimen:  . Are orders appropriate based on the patient's diagnosis, regimen, and cycle? Yes . Does the plan date match the patient's scheduled date? Yes . Is the sequencing of drugs appropriate? Yes . Are the premedications appropriate for the patient's regimen? Yes . Prior Authorization for treatment is: Pending o If applicable, is the correct biosimilar selected based on the patient's insurance? yes  Organ Function and Labs: [name] Kitchen Are dose adjustments needed based on the patient's renal function, hepatic function, or hematologic function? Yes . Are appropriate labs ordered prior to the start of patient's treatment? Yes . Other organ system assessment, if indicated: trastuzumab: Echo/ MUGA . The following baseline labs, if indicated, have been ordered: N/A  Dose Assessment: . Are the drug doses appropriate? Yes . Are the following correct: o Drug concentrations Yes o IV fluid compatible with drug Yes o Administration routes Yes o Timing of therapy Yes . If applicable, does the patient have documented access for treatment and/or plans for port-a-cath placement? not applicable . If applicable, have lifetime cumulative doses been properly documented and assessed? not applicable Lifetime Dose Tracking  No doses have been documented on this patient for the following tracked chemicals: Doxorubicin, Epirubicin, Idarubicin, Daunorubicin, Mitoxantrone, Bleomycin, Oxaliplatin, Carboplatin, Liposomal Doxorubicin  o   Toxicity Monitoring/Prevention: . The patient has the following take home antiemetics prescribed: Prochlorperazine and Lorazepam . The patient has the following take home medications prescribed: N/A . Medication allergies and previous infusion related reactions, if applicable, have been reviewed and addressed. No . The patient's current medication list has been assessed for drug-drug  interactions with their chemotherapy regimen. no significant drug-drug interactions were identified on review.  Order Review: . Are the treatment plan orders signed? No . Is the patient scheduled to see a provider prior to their treatment? Yes  I verify that I have reviewed each item in the above checklist and answered each question accordingly.  Anita Mora D 10/19/2020 4:13 PM

## 2020-10-19 NOTE — Telephone Encounter (Signed)
This RN obtained order for zienxtenzo and sent to [facility].  Above is for pt to self administer post chemotherapy per insurance benefit.

## 2020-10-19 NOTE — Telephone Encounter (Signed)
Unable to reach patient to follow up from [facility] 1/12. Her voicemail is full.

## 2020-10-20 ENCOUNTER — Ambulatory Visit (HOSPITAL_COMMUNITY)
Admission: RE | Admit: 2020-10-20 | Discharge: 2020-10-20 | Disposition: A | Discharge status: Home / Self Care | Payer: Managed Care, Other (non HMO) | Source: Ambulatory Visit | Attending: Oncology | Admitting: Oncology

## 2020-10-20 ENCOUNTER — Telehealth: Payer: Self-pay | Admitting: *Deleted

## 2020-10-20 ENCOUNTER — Encounter (HOSPITAL_COMMUNITY): Payer: Self-pay

## 2020-10-20 DIAGNOSIS — C50919 Malignant neoplasm of unspecified site of unspecified female breast: Secondary | ICD-10-CM | POA: Diagnosis not present

## 2020-10-20 MED ORDER — IOHEXOL 300 MG/ML  SOLN
75.0000 mL | Freq: Once | INTRAMUSCULAR | Status: AC | PRN
  Administered 2020-10-20: 75 mL via INTRAVENOUS

## 2020-10-20 NOTE — Telephone Encounter (Signed)
Received message that patient is asking about her scans.  Called patient to let her kow that it doesn't look like there is any metastatic disease other than her axillary adenopathy.  She was somewhat upset that the MD had not called her and that no one called her to let her know that she had an appointment with Dr. Jana Hakim 1/24. Informed her I would let the appropriate people know her concerns. Also her prescriptions have been sent to the wrong pharmacy. I verified pharmacy and will get these switched over. She will call with any other questions or concerns.

## 2020-10-22 NOTE — Progress Notes (Signed)
[place]  [phone] Fax:(336) [phone]     ID: Anita Mora DOB: 1976/06/08  MR#: [id number]  BPZ#:[id number]  Patient Care Team: Servando Salina, MD as PCP - General (Obstetrics and Gynecology) Rockwell Germany, RN as Oncology Nurse Navigator Mauro Kaufmann, RN as Oncology Nurse Navigator Erroll Luna, MD as Consulting Physician (General Surgery) Deshan Hemmelgarn, [name], MD as Consulting Physician (Oncology) Eppie Gibson, MD as Attending Physician (Radiation Oncology) Chauncey Cruel, MD OTHER MD:  CHIEF COMPLAINT: triple positive breast cancer  CURRENT TREATMENT: Neoadjuvant chemotherapy   INTERVAL HISTORY: Anita Mora returns today for follow up of her triple positive breast cancer. She was evaluated in the multidisciplinary breast cancer clinic on 10/11/2020.  She is accompanied by her husband Anita Mora  She underwent genetic counseling during clinic. Results are still pending.  Since consultation, she underwent bone scan on 10/17/2020 showing: no definite evidence of osseous metastatic disease. She also underwent chest CT on 10/20/2020 showing: left axillary adenopathy compatible with metastatic adenopathy; no additional sites of disease.  Finally she underwent echocardiogram on 10/19/2020 showing an ejection fraction of 60-65%.  All those records and results were given to her today  She is scheduled for bilateral MRI guided breast biopsies 10/30/2020, as well as port placement on 10/25/2020 and neoadjuvant chemotherapy on 10/26/2020.   REVIEW OF SYSTEMS: Anita Mora met with our chemotherapy teaching nurse and tells me she learned a great deal.  She still had many questions which we clarified today.  She has decided that she does not want to use the EMLA cream but will use ice to numb the port when accessed.   COVID 19 VACCINATION STATUS: fully vaccinated [employer]), with booster 09/2020   HISTORY OF CURRENT ILLNESS: From the original intake  note:  Anita Mora had routine screening mammography on 08/21/2020 showing a possible abnormality in the left axilla. She underwent left breast ultrasonography at [facility] on 09/18/2020 showing: three abnormal lymph nodes, at least two of which have lost their fatty hila and are suspicious for possible malignancy; 1.4 cm irregular mass in left breast at 3 o'clock; 1.7 cm oval simple cyst in left breast at 2 o'clock is benign.  Accordingly on 09/28/2020 she proceeded to biopsy of the left axilla area in question. The pathology from this procedure ([id number]) showed: lymph node with metastatic carcinoma. Prognostic indicators significant for: estrogen receptor, 50% positive and progesterone receptor, 80% positive, both with moderate staining intensity. Proliferation marker Ki67 at 30%. HER2 positive by immunohistochemistry (3+).  The left breast mass at 3 o'clock was also biopsied at that time and showed a fibroepithelial lesion. No invasive carcinoma was identified.  She underwent breast MRI on 10/09/2020 showing: breast composition C; two abnormal-appearing left axillary lymph nodes, one of which is biopsy-proven to contain metastatic disease; initially no suspicious findings within left breast were noted, but with subsequent review a 0.6 cm area in the left breast will need biopsy; also an indeterminate 1 cm lower-inner right breast mass will require biopsy as well   The patient's subsequent history is as detailed below.   PAST MEDICAL HISTORY: Past Medical History:  Diagnosis Date  . Cancer ([place])   . Family history of breast cancer   . Family history of colon cancer   . Family history of leukemia   . Family history of prostate cancer   . Hemorrhoids   . Seasonal allergies     PAST SURGICAL HISTORY: Past Surgical History:  Procedure Laterality Date  . HEMORROIDECTOMY  FAMILY HISTORY: Family History  Problem Relation Age of Onset  . Hypertension Mother   . Breast cancer  Mother 46       gene testing  - neg at [facility]  . Colon cancer Maternal Grandmother        dx 42s  . Prostate cancer Paternal Uncle        dx 20s  . Prostate cancer Maternal Uncle        60's  . Aneurysm Maternal Grandfather 49  . Stroke Paternal Grandmother   . Leukemia Other 10       Paternal cousin's son  . Prostate cancer Maternal Uncle 80  . Prostate cancer Paternal Uncle        dx 13s   Her parents are both living-- father at 45, mother at 36-- as of 09/2020. Anita Mora has one brother and one sister. She reports breast cancer in her mother at age 69 (she had genetic testing done at [facility], which was negative), prostate cancer in two paternal uncles (both in their 61's) and two maternal uncles (one at 75, one in 44's), and colon cancer in her maternal grandmother in her 43's.   GYNECOLOGIC HISTORY:  Patient's last menstrual period was 10/03/2020 (approximate). Menarche: 45 years old Age at first live birth: 45 years old Dar[place] P 3 LMP 10/06/2020 Contraceptive: used for more than 2 years with no complications; husband now s/p vasectomy HRT n/a  Hysterectomy? no BSO? no   SOCIAL HISTORY: (updated 09/2020)  Anita Mora works as a [employer]. Spouse Anita Mora is a Music therapist with [employer]. She lives at home with Anita Mora, two of their children-- Anita Mora age 57, and Anita Mora age 81-- and their dog. Their third child, their daughter Anita Mora, age 56, is at [employer] in [place]. Anita Mora attends [employer].    ADVANCED DIRECTIVES: in place   HEALTH MAINTENANCE: Social History   Tobacco Use  . Smoking status: Never Smoker  . Smokeless tobacco: Never Used  Substance Use Topics  . Alcohol use: Yes    Comment: occassional  . Drug use: No     Colonoscopy: n/a (age)  PAP: up to date  Bone density: n/a (age)   No Known Allergies  Current Outpatient Medications  Medication Sig Dispense Refill  . dexamethasone (DECADRON) 4 MG tablet Take 2 tablets (8 mg total) by mouth 2  (two) times daily. Start the day before Taxotere. Then take daily x 3 days after chemotherapy. 30 tablet 1  . lidocaine-prilocaine (EMLA) cream Apply to affected area once 30 g 3  . LORazepam (ATIVAN) 0.5 MG tablet Take 1 tablet (0.5 mg total) by mouth at bedtime as needed (Nausea or vomiting). 20 tablet 0  . meloxicam (MOBIC) 15 MG tablet One tab PO qAM with breakfast for 2 weeks, then daily prn pain. 30 tablet 3  . MULTIPLE VITAMIN PO Take by mouth.    . prochlorperazine (COMPAZINE) 10 MG tablet Take 1 tablet (10 mg total) by mouth every 6 (six) hours as needed (Nausea or vomiting). 30 tablet 1   No current facility-administered medications for this visit.    OBJECTIVE: African-American woman in no acute distress  Vitals:   10/23/20 1633  BP: 104/67  Pulse: 88  Resp: 18  Temp: 98 F (36.7 C)  SpO2: 95%     Body mass index is 28.84 kg/m.   Wt Readings from Last 3 Encounters:  10/23/20 189 lb 11.2 oz (86 kg)  10/11/20 192 lb 9.6 oz (87.4  kg)  04/04/14 187 lb (84.8 kg)      ECOG FS:0 - Asymptomatic  Sclerae unicteric, EOMs intact Wearing a mask No cervical or supraclavicular adenopathy Lungs no rales or rhonchi Heart regular rate and rhythm Abd soft, nontender, positive bowel sounds MSK no focal spinal tenderness, no upper extremity lymphedema Neuro: nonfocal, well oriented, appropriate affect Breasts: I do not palpate a mass in either breast.  LAB RESULTS:  CMP     Component Value Date/Time   NA 137 10/11/2020 0845   K 4.5 10/11/2020 0845   CL 103 10/11/2020 0845   CO2 29 10/11/2020 0845   GLUCOSE 96 10/11/2020 0845   BUN 14 10/11/2020 0845   CREATININE 1.07 (H) 10/11/2020 0845   CALCIUM 9.6 10/11/2020 0845   PROT 8.2 (H) 10/11/2020 0845   ALBUMIN 4.0 10/11/2020 0845   AST 19 10/11/2020 0845   ALT 12 10/11/2020 0845   ALKPHOS 47 10/11/2020 0845   BILITOT 0.7 10/11/2020 0845   GFRNONAA >60 10/11/2020 0845    No results found for: TOTALPROTELP, ALBUMINELP,  A1GS, A2GS, BETS, BETA2SER, GAMS, MSPIKE, SPEI  Lab Results  Component Value Date   WBC 4.9 10/11/2020   NEUTROABS 2.4 10/11/2020   HGB 11.3 (L) 10/11/2020   HCT 34.0 (L) 10/11/2020   MCV 89.0 10/11/2020   PLT 254 10/11/2020    No results found for: LABCA2  No components found for: [id number]  No results for input(s): INR in the last 168 hours.  No results found for: LABCA2  No results found for: CVE938  No results found for: BOF751  No results found for: WCH852  No results found for: CA2729  No components found for: HGQUANT  No results found for: CEA1 / No results found for: CEA1   No results found for: AFPTUMOR  No results found for: CHROMOGRNA  No results found for: KPAFRELGTCHN, LAMBDASER, KAPLAMBRATIO (kappa/lambda light chains)  No results found for: HGBA, HGBA2QUANT, HGBFQUANT, HGBSQUAN (Hemoglobinopathy evaluation)   No results found for: LDH  No results found for: IRON, TIBC, IRONPCTSAT (Iron and TIBC)  No results found for: FERRITIN  Urinalysis    Component Value Date/Time   LABSPEC 1.020 08/06/2008 0745   PHURINE 7.5 08/06/2008 0745   GLUCOSEU NEGATIVE 08/06/2008 0745   HGBUR LARGE (A) 08/06/2008 0745   BILIRUBINUR SMALL (A) 08/06/2008 0745   KETONESUR NEGATIVE 08/06/2008 0745   PROTEINUR 100 (A) 08/06/2008 0745   UROBILINOGEN 0.2 08/06/2008 0745   NITRITE NEGATIVE 08/06/2008 0745   LEUKOCYTESUR TRACE (A) 08/06/2008 0745    STUDIES: CT Chest W Contrast  Result Date: 10/20/2020 CLINICAL DATA:  Staging breast cancer EXAM: CT CHEST WITH CONTRAST TECHNIQUE: Multidetector CT imaging of the chest was performed during intravenous contrast administration. CONTRAST:  54mL OMNIPAQUE IOHEXOL 300 MG/ML  SOLN COMPARISON:  None. FINDINGS: Cardiovascular: The heart size appears within normal limits. No pericardial effusion. Mediastinum/Nodes: Normal appearance of the thyroid gland. The trachea appears patent and is midline. Normal appearance of the  esophagus. Left axillary adenopathy. Index lymph node measures 1.8 cm short axis, image 44/2. No supraclavicular or right axillary adenopathy. No mediastinal or hilar adenopathy. Lungs/Pleura: Lungs are clear. No pleural effusion or pneumothorax. No suspicious lung nodules Upper Abdomen: No acute abnormality. Musculoskeletal: No chest wall abnormality. No acute or significant osseous findings. No breast mass noted. IMPRESSION: 1. Left axillary adenopathy compatible with metastatic adenopathy. 2. No additional sites of disease identified. Electronically Signed   By: Kerby Moors M.D.   On: 10/20/2020  10:25   NM Bone Scan Whole Body  Result Date: 10/17/2020 CLINICAL DATA:  New diagnosis breast cancer, staging, metastatic lymph node, history of chronic BILATERAL knee and LEFT ankle pain, no recent fracture or, trauma or bone surgery EXAM: NUCLEAR MEDICINE WHOLE BODY BONE SCAN TECHNIQUE: Whole body anterior and posterior images were obtained approximately 3 hours after intravenous injection of radiopharmaceutical. RADIOPHARMACEUTICALS:  20 mCi Technetium-27m MDP IV COMPARISON:  None Radiographic correlation: None FINDINGS: Uptake at shoulders, sternoclavicular joints, elbows, hips, knees, ankles, typically degenerative. Uptake at the lateral aspects of the lumbar spine bilaterally at L4-L5, greater on RIGHT, favor degenerative. No definite worrisome sites of tracer uptake are identified to suggest osseous metastatic disease. Expected urinary tract and soft tissue distribution of tracer. IMPRESSION: Numerous sites of uptake likely degenerative in origin as above. No definite scintigraphic evidence of osseous metastatic disease. Electronically Signed   By: Lavonia Dana M.D.   On: 10/17/2020 14:10   MR BREAST BILATERAL W WO CONTRAST INC CAD  Addendum Date: 10/11/2020   ADDENDUM REPORT: 10/11/2020 10:22 ADDENDUM: After further review, a 0.4 cm focus within the posterior central LEFT breast (series 12: Image 69) is  identified. Given metastatic breast cancer within a LEFT axillary lymph node and no other suspicious abnormality within the LEFT breast, tissue sampling would be recommended to exclude malignancy. RECOMMENDATION: MR guided LEFT breast biopsy. BI-RADS category:  4: Suspicious. Dr. [name] is aware of this change. Electronically Signed   By: [name] M.D.   On: 10/11/2020 10:22   Result Date: 10/11/2020 CLINICAL DATA:  45 year old female with recent diagnosis of metastatic carcinoma within a LEFT axillary lymph node. Recent biopsy of RETROAREOLAR LEFT breast mass demonstrating a fibroepithelial lesion favored to be a fibroadenoma. LABS:  None performed today EXAM: BILATERAL BREAST MRI WITH AND WITHOUT CONTRAST TECHNIQUE: Multiplanar, multisequence MR images of both breasts were obtained prior to and following the intravenous administration of 8 ml of Gadavist Three-dimensional MR images were rendered by post-processing of the original MR data on an independent workstation. The three-dimensional MR images were interpreted, and findings are reported in the following complete MRI report for this study. Three dimensional images were evaluated at the independent interpreting workstation using the DynaCAD thin client. COMPARISON:  Prior mammogram and ultrasounds from [facility] FINDINGS: Breast composition: c. Heterogeneous fibroglandular tissue. Background parenchymal enhancement: Mild Right breast: A 1 cm irregular mass is identified within the posterior LOWER INNER RIGHT breast (series 12: Image 98). No other suspicious mass or worrisome enhancement is identified. Multiple benign cysts are present throughout the RIGHT breast. Left breast: Biopsy clip artifact within the OUTER LEFT breast, middle depth noted. No suspicious abnormalities are identified within the LEFT breast. Multiple cysts are present throughout the LEFT breast. Lymph nodes: 2 abnormal appearing LEFT axillary lymph nodes are identified, the largest 1  containing biopsy clip artifact. No other abnormal appearing lymph nodes are identified. Ancillary findings:  None. IMPRESSION: 1. Two abnormal appearing LEFT axillary lymph nodes, 1 of which is biopsy-proven to contain metastatic disease. No suspicious MR findings within the LEFT breast. 2. Indeterminate 1 cm LOWER INNER RIGHT breast mass. 2nd-look ultrasound and possible biopsy is recommended. If this mass is not identified sonographically, than MR guided biopsy would be recommended. RECOMMENDATION: 1. 2nd-look RIGHT breast ultrasound with possible biopsy of the 1 cm LOWER INNER RIGHT breast mass. If this mass is not identified sonographically, then MR guided RIGHT breast biopsy is recommended. BI-RADS CATEGORY  4: Suspicious. Electronically Signed: By:  [name] M.D. On: 10/09/2020 15:33   ECHOCARDIOGRAM COMPLETE  Result Date: 10/19/2020    ECHOCARDIOGRAM REPORT   Patient Name:   Anita Mora Date of Exam: 10/19/2020 Medical Rec #:  [id number]       Height:       68.0 in Accession #:    [id number]      Weight:       188.0 lb Date of Birth:  [birth year]      BSA:          1.991 m Patient Age:    63 years        BP:           121/72 mmHg Patient Gender: F               HR:           75 bpm. Exam Location:  Outpatient Procedure: 2D Echo Indications:    Chemo Z09  History:        Patient has no prior history of Echocardiogram examinations.  Sonographer:    Mikki Santee RDCS (AE) Referring Phys: [place]  1. Left ventricular ejection fraction, by estimation, is 60 to 65%. The left ventricle has normal function. The left ventricle has no regional wall motion abnormalities. Left ventricular diastolic parameters were normal. The average left ventricular global longitudinal strain is -21.2 %. The global longitudinal strain is normal.  2. Right ventricular systolic function is normal. The right ventricular size is normal.  3. The mitral valve is normal in structure. No evidence of  mitral valve regurgitation. No evidence of mitral stenosis.  4. The aortic valve is normal in structure. Aortic valve regurgitation is not visualized. No aortic stenosis is present.  5. The inferior vena cava is normal in size with greater than 50% respiratory variability, suggesting right atrial pressure of 3 mmHg. FINDINGS  Left Ventricle: Left ventricular ejection fraction, by estimation, is 60 to 65%. The left ventricle has normal function. The left ventricle has no regional wall motion abnormalities. The average left ventricular global longitudinal strain is -21.2 %. The global longitudinal strain is normal. The left ventricular internal cavity size was normal in size. There is no left ventricular hypertrophy. Left ventricular diastolic parameters were normal. Right Ventricle: The right ventricular size is normal. No increase in right ventricular wall thickness. Right ventricular systolic function is normal. Left Atrium: Left atrial size was normal in size. Right Atrium: Right atrial size was normal in size. Pericardium: There is no evidence of pericardial effusion. Mitral Valve: The mitral valve is normal in structure. No evidence of mitral valve regurgitation. No evidence of mitral valve stenosis. Tricuspid Valve: The tricuspid valve is normal in structure. Tricuspid valve regurgitation is not demonstrated. No evidence of tricuspid stenosis. Aortic Valve: The aortic valve is normal in structure. Aortic valve regurgitation is not visualized. No aortic stenosis is present. Pulmonic Valve: The pulmonic valve was normal in structure. Pulmonic valve regurgitation is not visualized. No evidence of pulmonic stenosis. Aorta: The aortic root is normal in size and structure. Venous: The inferior vena cava is normal in size with greater than 50% respiratory variability, suggesting right atrial pressure of 3 mmHg. IAS/Shunts: No atrial level shunt detected by color flow Doppler.  LEFT VENTRICLE PLAX 2D LVIDd:          4.20 cm  Diastology LVIDs:         2.70 cm  LV e' medial:    9.68  cm/s LV PW:         0.80 cm  LV E/e' medial:  10.4 LV IVS:        0.60 cm  LV e' lateral:   12.50 cm/s LVOT diam:     2.00 cm  LV E/e' lateral: 8.1 LV SV:         64 LV SV Index:   32       2D Longitudinal Strain LVOT Area:     3.14 cm 2D Strain GLS Avg:     -21.2 %  RIGHT VENTRICLE RV S prime:     14.50 cm/s TAPSE (M-mode): 2.4 cm LEFT ATRIUM             Index       RIGHT ATRIUM           Index LA diam:        2.30 cm 1.16 cm/m  RA Area:     11.00 cm LA Vol (A2C):   34.0 ml 17.08 ml/m RA Volume:   26.10 ml  13.11 ml/m LA Vol (A4C):   20.7 ml 10.40 ml/m LA Biplane Vol: 29.1 ml 14.62 ml/m  AORTIC VALVE LVOT Vmax:   102.00 cm/s LVOT Vmean:  63.600 cm/s LVOT VTI:    0.205 m  AORTA Ao Root diam: 2.90 cm MITRAL VALVE MV Area (PHT): 3.31 cm     SHUNTS MV Decel Time: 229 msec     Systemic VTI:  0.20 m MV E velocity: 101.00 cm/s  Systemic Diam: 2.00 cm MV A velocity: 68.90 cm/s MV E/A ratio:  1.47 Candee Furbish MD Electronically signed by Candee Furbish MD Signature Date/Time: 10/19/2020/10:42:09 AM    Final      ELIGIBLE FOR AVAILABLE RESEARCH PROTOCOL: AET  ASSESSMENT: 45 y.o. [place], [place] woman status post left axillary lymph node biopsy 09/28/2020 for adenocarcinoma, grade not stated, estrogen and progesterone receptor positive, HER2 amplified, with an MIB-1 of 30%  (a) biopsy of a left breast upper outer quadrant mass 09/28/2020 showed fibroadenoma  (b) biopsy of a left breast 0.6 cm mass 10/30/2020  (c) biopsy of a right breast 1 cm mass 10/30/2020  (d) bone scan 10/17/2020 and CT of the chest with contrast 10/20/2020 showed no evidence of metastatic disease  (1) neoadjuvant chemotherapy consisting of carboplatin and docetaxel every 21 days x 4 given concurrently with trastuzumab and Pertuzumab to start 10/26/2020  (a) patient will self-administer Ziextenzo at home day 3 of every cycle  (2) trastuzumab and pertuzumab to be continued  to complete a year  (a) echo 10/19/2020 shows an ejection fraction in the 60-65% range  (3) definitive surgery to follow  (4) adjuvant radiation as appropriate  (5) genetics testing  (6) antiestrogens   PLAN: I met today with Anita Mora and her husband Anita Mora to review her situation and to clear any questions she might still have regarding her treatment plan.  She will have a port placed 10/25/2020 and then chemo the next day.  She knows her port will be left accessed and that was discussed.  With subsequent treatments she prefers to use some ice to numb the port area rather than the EMLA cream and that is fine.  We reviewed how to take the Claritin and why she is taking it, clarified when to take the Compazine and dexamethasone, and again went over what she might expect over the next several days.  I asked her to keep a symptom diary and we will review that when  she returns to see me 02/02 2020.  Note that she is planning a trip to [place] 11/03/2020 and also 11/19/2020.  On 12/28/2020 they are planning another weekend trip and that chemotherapy likely will have to be moved to 01/01/2021  Overall it is very impressive well and thoroughly Anita Mora plans for everything.  Her husband taped today's session so they can review it later as needed  Total encounter time 60 minutes.    [name]. Rylin Saez, MD 10/23/2020 6:02 PM Medical Oncology and Hematology [facility] [place], [place] [address] Tel. [phone]    Fax. [phone]   This document serves as a record of services personally performed by Lurline Del, MD. It was created on his behalf by Wilburn Mylar, a trained medical scribe. The creation of this record is based on the scribe's personal observations and the provider's statements to them.   I, Lurline Del MD, have reviewed the above documentation for accuracy and completeness, and I agree with the above.   *Total Encounter Time as defined  by the Centers for Medicare and Medicaid Services includes, in addition to the face-to-face time of a patient visit (documented in the note above) non-face-to-face time: obtaining and reviewing outside history, ordering and reviewing medications, tests or procedures, care coordination (communications with other health care professionals or caregivers) and documentation in the medical record.

## 2020-10-23 ENCOUNTER — Other Ambulatory Visit (HOSPITAL_COMMUNITY)
Admission: RE | Admit: 2020-10-23 | Discharge: 2020-10-23 | Disposition: A | Discharge status: Home / Self Care | Payer: Managed Care, Other (non HMO) | Source: Ambulatory Visit | Attending: Surgery | Admitting: Surgery

## 2020-10-23 ENCOUNTER — Inpatient Hospital Stay (HOSPITAL_BASED_OUTPATIENT_CLINIC_OR_DEPARTMENT_OTHER): Payer: Managed Care, Other (non HMO) | Admitting: Oncology

## 2020-10-23 ENCOUNTER — Other Ambulatory Visit: Payer: Self-pay | Admitting: *Deleted

## 2020-10-23 ENCOUNTER — Encounter: Payer: Self-pay | Admitting: *Deleted

## 2020-10-23 ENCOUNTER — Telehealth: Payer: Self-pay | Admitting: *Deleted

## 2020-10-23 ENCOUNTER — Other Ambulatory Visit: Payer: Self-pay

## 2020-10-23 VITALS — BP 104/67 | HR 88 | Temp 98.0°F | Resp 18 | Ht 68.0 in | Wt 189.7 lb

## 2020-10-23 DIAGNOSIS — C50919 Malignant neoplasm of unspecified site of unspecified female breast: Secondary | ICD-10-CM

## 2020-10-23 DIAGNOSIS — Z20822 Contact with and (suspected) exposure to covid-19: Secondary | ICD-10-CM | POA: Insufficient documentation

## 2020-10-23 DIAGNOSIS — Z01812 Encounter for preprocedural laboratory examination: Secondary | ICD-10-CM | POA: Insufficient documentation

## 2020-10-23 DIAGNOSIS — C773 Secondary and unspecified malignant neoplasm of axilla and upper limb lymph nodes: Secondary | ICD-10-CM | POA: Diagnosis not present

## 2020-10-23 LAB — SARS CORONAVIRUS 2 (TAT 6-24 HRS): SARS Coronavirus 2: NEGATIVE

## 2020-10-23 NOTE — Telephone Encounter (Signed)
This RN was notified last week that pt would have to self inject at home for CSF support. Pt may receive first injection here per office supply but then proceed with home injections.  Informed by pharmacy and PA dept that zeintenzo or neulasta was preferred and did not need a prior authorization.  Informed to fax to [facility].  Order obtained and faxed per above on 10/19/2020.  This RN called [facility] today to follow up and spoke with [place]. Informed prescription was declined due to need for prior auth - with number given to obtain prior auth at [phone].  Once authorization received [facility] will contact the patient for shipment and payment arrangement.  Was also informed that pt's copay once PA obtained is $100 per syringe ( Q 28 days ) as well pt has not met her out of pocket deductible so her first dispense is at out of pocket cost of $1023.  [facility] does offer co pay assist and pt can call to enroll.  This RN then contacted  [facility] and spoke with Estill Bamberg - note per nurse inquiry there was not a biosimular or generic that did not require a prior authorization.  Clinical data given per call and obtained PA.  This RN attempted to contact the patient to discuss above- no answer and VM stated mailbox is full.  This note will be forwarded to the navigators and MD for review and possible assistance in communication with the patient.

## 2020-10-23 NOTE — Telephone Encounter (Signed)
Perfect- I overlooked that- thanks

## 2020-10-23 NOTE — Progress Notes (Signed)

## 2020-10-23 NOTE — Telephone Encounter (Signed)
Patient is seeing Dr. Jana Hakim today at 4.

## 2020-10-24 ENCOUNTER — Other Ambulatory Visit: Payer: Self-pay | Admitting: Radiology

## 2020-10-24 DIAGNOSIS — R9389 Abnormal findings on diagnostic imaging of other specified body structures: Secondary | ICD-10-CM

## 2020-10-25 ENCOUNTER — Telehealth: Payer: Self-pay | Admitting: *Deleted

## 2020-10-25 ENCOUNTER — Other Ambulatory Visit (HOSPITAL_COMMUNITY): Payer: Managed Care, Other (non HMO)

## 2020-10-25 ENCOUNTER — Ambulatory Visit (HOSPITAL_BASED_OUTPATIENT_CLINIC_OR_DEPARTMENT_OTHER): Admission: RE | Admit: 2020-10-25 | Payer: Managed Care, Other (non HMO) | Source: Home / Self Care | Admitting: Surgery

## 2020-10-25 HISTORY — DX: Malignant (primary) neoplasm, unspecified: C80.1

## 2020-10-25 SURGERY — INSERTION, TUNNELED CENTRAL VENOUS DEVICE, WITH PORT
Anesthesia: General

## 2020-10-25 NOTE — Telephone Encounter (Signed)
Called pt to offer services in future if needed as pt is going to [facility] for treatment. Unable to leave vm as vm is full

## 2020-10-25 NOTE — Telephone Encounter (Signed)
Received vm from [place] at [facility] stating pt will continue care that their facility.

## 2020-10-25 NOTE — Telephone Encounter (Signed)
Received notification pt will receive treatment at [facility]. Physician team notified

## 2020-10-26 ENCOUNTER — Inpatient Hospital Stay: Payer: Managed Care, Other (non HMO)

## 2020-10-27 DIAGNOSIS — Z1379 Encounter for other screening for genetic and chromosomal anomalies: Secondary | ICD-10-CM | POA: Insufficient documentation

## 2020-10-28 ENCOUNTER — Ambulatory Visit: Payer: Managed Care, Other (non HMO)

## 2020-10-29 ENCOUNTER — Other Ambulatory Visit: Payer: Self-pay | Admitting: Adult Health

## 2020-10-29 NOTE — Progress Notes (Signed)
D/c orders due to patient transfer in care.

## 2020-10-30 ENCOUNTER — Telehealth: Payer: Self-pay | Admitting: *Deleted

## 2020-10-30 NOTE — Telephone Encounter (Signed)
This RN was contacted by [name] nurse navigator at [facility] stating need for our office to cancel our current prior authorization for chemotherapy so it can be obtained per their facility.  This RN contacted [name] in the prior auth dept per above who states she contacted pt's insurance and as of 830 am this morning our Anita Mora has been canceled for chemo and neulasta.

## 2020-10-31 ENCOUNTER — Ambulatory Visit: Payer: Self-pay | Admitting: Genetic Counselor

## 2020-10-31 ENCOUNTER — Encounter: Payer: Self-pay | Admitting: Genetic Counselor

## 2020-10-31 ENCOUNTER — Telehealth: Payer: Self-pay | Admitting: Genetic Counselor

## 2020-10-31 DIAGNOSIS — Z1379 Encounter for other screening for genetic and chromosomal anomalies: Secondary | ICD-10-CM

## 2020-10-31 NOTE — Telephone Encounter (Signed)
Revealed negative genetic testing. Discussed that we do not know why she has breast cancer or why there is cancer in the family. There could be a genetic mutation in the family that Anita Mora did not inherit. There could also be a mutation in a different gene that we are not testing, or our current technology may not be able detect certain mutations. It will therefore be important for her to stay in contact with genetics to keep up with whether additional testing may be appropriate in the future.   Three variants of uncertain significance were detected - one in the MSH3 gene called p.D45H (c.133G>C), a second in the [facility] gene called p.D887N (c.2659G>A), and a third in the MSH6 gene called p.Z300T (c.2419G>A). Her result is still considered normal at this time and should not impact her medical management.

## 2020-10-31 NOTE — Progress Notes (Signed)
HPI:  [name] was previously seen in the [place] clinic due to a personal and family history of cancer and concerns regarding a hereditary predisposition to cancer. Please refer to our prior cancer genetics clinic note for more information regarding our discussion, assessment and recommendations, at the time. [name]'s recent genetic test results were disclosed to her, as were recommendations warranted by these results. These results and recommendations are discussed in more detail below.  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 Three variants of uncertain significance (VUS) were detected - one in the MSH3 gene called p.D45H (c.133G>C), a second in the [facility] gene called p.D887N (c.2659G>A), and a third in the MSH6 gene called p.E332R (c.2419G>A). The report date is 10/27/2020.  The CustomNext-Cancer+RNAinsight panel offered by [facility] includes sequencing and rearrangement analysis for the following 47 genes:  APC, ATM, AXIN2, BARD1, BMPR1A, BRCA1, BRCA2, BRIP1, CDH1, CDK4, CDKN2A, CHEK2, DICER1, EPCAM, GREM1, HOXB13, MEN1, MLH1, MSH2, MSH3, MSH6, MUTYH, NBN, NF1, NF2, NTHL1, PALB2, PMS2, POLD1, POLE, PTEN, RAD51C, RAD51D, RECQL, RET, SDHA, SDHAF2, SDHB, SDHC, SDHD, SMAD4, SMARCA4, STK11, TP53, TSC1, TSC2, and VHL.  RNA data is routinely analyzed for use in variant interpretation for all genes.     FAMILY  HISTORY:  We obtained a detailed, 4-generation family history.  Significant diagnoses are listed below: Family History  Problem Relation Age of Onset  . Hypertension Mother   . Breast cancer Mother 48       gene testing  - neg at [facility]  . Colon cancer Maternal Grandmother        dx 47s  . Prostate cancer Paternal Uncle        dx 49s  . Prostate cancer Maternal Uncle        60's  . Aneurysm Maternal Grandfather 45  . Stroke Paternal Grandmother   . Leukemia Other 10       Paternal cousin's son  . Prostate cancer Maternal Uncle 80  . Prostate cancer Paternal Uncle        dx 24s   [name] has two daughters and one son (ages 67-18). She has one brother (age 45) and one sister (age 23). None of these relatives have had cancer.  [name]'s mother is 43 and had breast cancer diagnosed at age 63. She had genetic testing through Dr. Joesph Mora at [facility] (record not available for review). [name] had eight maternal uncles and two maternal aunts. Two uncles had prostate cancer - one diagnosed in his 80s and the other diagnosed at age 63. One maternal first cousin was also diagnosed with prostate cancer at age 64. [name]'s maternal grandmother died at age 45 from colon cancer (diagnosed in her 4s). [name] maternal grandfather died at age 65 from a brain aneurysm.   [name] father is 9 and has not had cancer. She has two paternal aunts and two paternal uncles. Both uncles  were diagnosed with prostate cancer in their 25s. One of these uncles had a grandson who died from leukemia at age 58. [name]'s paternal grandmother died in her 44s from a stroke, and her paternal grandfather died in his 9s or 47s.  [name] is aware of previous family history of genetic testing for hereditary cancer risks. Patient's maternal ancestors are of [place] American and Native American descent, and paternal ancestors are of [place] American and Native American descent. There is  no reported Ashkenazi Jewish ancestry. There is no known consanguinity.  GENETIC TEST RESULTS: Genetic testing reported out on 10/27/2020 through the [facility] CustomNext-Cancer + RNAinsight panel. No pathogenic variants were detected.   The CustomNext-Cancer+RNAinsight panel offered by [facility] includes sequencing and rearrangement analysis for the following 47 genes:  APC, ATM, AXIN2, BARD1, BMPR1A, BRCA1, BRCA2, BRIP1, CDH1, CDK4, CDKN2A, CHEK2, DICER1, EPCAM, GREM1, HOXB13, MEN1, MLH1, MSH2, MSH3, MSH6, MUTYH, NBN, NF1, NF2, NTHL1, PALB2, PMS2, POLD1, POLE, PTEN, RAD51C, RAD51D, RECQL, RET, SDHA, SDHAF2, SDHB, SDHC, SDHD, SMAD4, SMARCA4, STK11, TP53, TSC1, TSC2, and VHL.  RNA data is routinely analyzed for use in variant interpretation for all genes. The test report will be scanned into EPIC and located under the Molecular Pathology section of the Results Review tab.  A portion of the result report is included below for reference.     Because current genetic testing is not perfect, it is possible there may be a gene mutation in one of these genes that current testing cannot detect, but that chance is small. We also discussed that there could be another gene that has not yet been discovered, or that we have not yet tested, that is responsible for the cancer diagnoses in the family. It is also possible there is a hereditary cause for the cancer in the family that [name] did not inherit and therefore was not identified in her testing. Therefore, it is important to remain in touch with cancer genetics in the future so that we can continue to offer [name] the most up to date genetic testing.   Genetic testing did identify three variants of uncertain significance (VUS) - one in the MSH3 gene called p.D45H (c.133G>C), a second in the [facility] gene called p.D887N (c.2659G>A), and a third in the MSH6 gene called p.Z366Y (c.2419G>A). At this time, it is unknown if these variants are associated with  increased cancer risk or if they are normal findings, but most variants such as these get reclassified to being inconsequential. They should not be used to make medical management decisions. With time, we suspect the lab will determine the significance of these variants, if any. If we do learn more about them, we will try to contact [name] to discuss it further. However, it is important to stay in touch with [place] periodically and keep the address and phone number up to date.  CANCER SCREENING RECOMMENDATIONS: [name] test result is considered negative (normal).  This means that we have not identified a hereditary cause for her personal and family history of cancer at this time. While reassuring, this does not definitively rule out a hereditary predisposition to cancer. It is still possible that there could be genetic mutations that are undetectable by current technology. There could be genetic mutations in genes that have not been tested or identified to increase cancer risk.  Therefore, it is recommended she continue to follow the cancer management and screening guidelines provided by her oncology and primary healthcare provider.   An  individual's cancer risk and medical management are not determined by genetic test results alone. Overall cancer risk assessment incorporates additional factors, including personal medical history, family history, and any available genetic information that may result in a personalized plan for cancer prevention and surveillance.  RECOMMENDATIONS FOR FAMILY MEMBERS:  Individuals in this family might be at some increased risk of developing cancer, over the general population risk, simply due to the family history of cancer.  We recommended women in this family have a yearly mammogram beginning at age 64, or 52 years younger than the earliest onset of cancer, an annual clinical breast exam, and perform monthly breast self-exams. Women in this family should also have a  gynecological exam as recommended by their primary provider. All family members should be referred for colonoscopy starting at age 70.  FOLLOW-UP: Lastly, we discussed with [name] that cancer genetics is a rapidly advancing field and it is possible that new genetic tests will be appropriate for her and/or her family members in the future. We encouraged her to remain in contact with cancer genetics on an annual basis so we can update her personal and family histories and let her know of advances in cancer genetics that may benefit this family.   Our contact number was provided. [name]'s questions were answered to her satisfaction, and she knows she is welcome to call us at anytime with additional questions or concerns.   Clint Guy, MS, [facility] Genetic Counselor [email or web address] Phone: [phone]

## 2020-11-01 ENCOUNTER — Other Ambulatory Visit: Payer: Managed Care, Other (non HMO)

## 2020-11-01 ENCOUNTER — Ambulatory Visit: Payer: Managed Care, Other (non HMO) | Admitting: Oncology

## 2020-11-08 MED ORDER — DEXAMETHASONE 4 MG PO TABS: 8.0000 mg | ORAL_TABLET | Freq: Two times a day (BID) | ORAL | 1 refills | Status: AC

## 2020-11-08 MED ORDER — PROCHLORPERAZINE MALEATE 10 MG PO TABS: 10.0000 mg | ORAL_TABLET | Freq: Four times a day (QID) | ORAL | 1 refills | Status: AC | PRN

## 2020-11-08 NOTE — Telephone Encounter (Signed)
Error. Melanie M Rodgers, RN  

## 2020-11-16 ENCOUNTER — Ambulatory Visit: Payer: Managed Care, Other (non HMO)

## 2020-11-16 ENCOUNTER — Other Ambulatory Visit: Payer: Managed Care, Other (non HMO)

## 2020-12-07 ENCOUNTER — Other Ambulatory Visit: Payer: Managed Care, Other (non HMO)

## 2020-12-07 ENCOUNTER — Ambulatory Visit: Payer: Managed Care, Other (non HMO)

## 2022-10-30 IMAGING — NM NM BONE WHOLE BODY
2 series · 2 of 2 positions shown · non-contrast
Comparison: None

Radiographic correlation: None

CLINICAL DATA: New diagnosis breast cancer, staging, metastatic
lymph node, history of chronic BILATERAL knee and LEFT ankle pain,
no recent fracture or, trauma or bone surgery

EXAM:
NUCLEAR MEDICINE WHOLE BODY BONE SCAN
TECHNIQUE: Whole body anterior and posterior images were obtained approximately
3 hours after intravenous injection of radiopharmaceutical.
RADIOPHARMACEUTICALS:  20 mCi Technetium-RRm MDP IV

[Series 1: whole body · 2.66mm/px · 1 of 1 slices shown (1 of 2)]
[im 1/1]
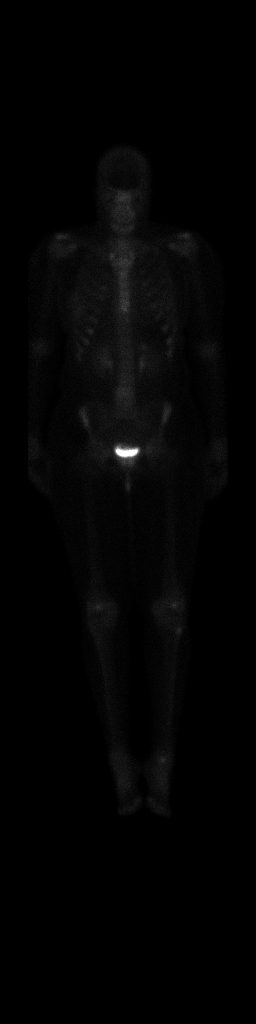

[Series 1: whole body · 2.66mm/px · 1 of 1 slices shown (2 of 2)]
[im 1/1]
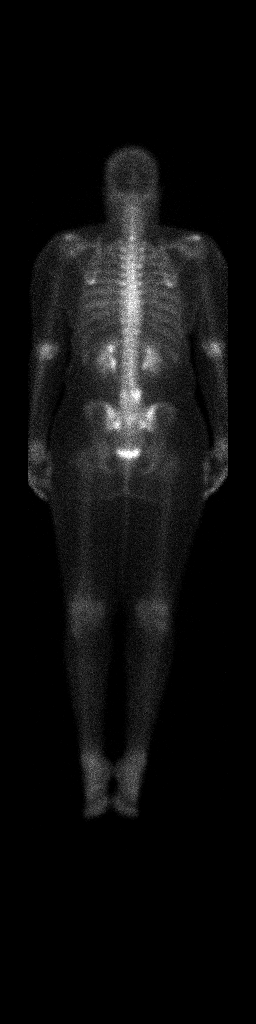

[2 of 2 positions shown; findings below may reference images not displayed]

FINDINGS: Uptake at shoulders, sternoclavicular joints, elbows, hips, knees,
ankles, typically degenerative.

Uptake at the lateral aspects of the lumbar spine bilaterally at
L4-L5, greater on RIGHT, favor degenerative.

No definite worrisome sites of tracer uptake are identified to
suggest osseous metastatic disease.

Expected urinary tract and soft tissue distribution of tracer.
IMPRESSION: Numerous sites of uptake likely degenerative in origin as above.

No definite scintigraphic evidence of osseous metastatic disease.

## 2022-11-02 IMAGING — CT CT CHEST W/ CM
2 of 4 series · 15 of 36 positions shown, 18 images · IV contrast (OMNIPAQUE)
Comparison: None.

CLINICAL DATA: Staging breast cancer

EXAM:
CT CHEST WITH CONTRAST
TECHNIQUE: Multidetector CT imaging of the chest was performed during
intravenous contrast administration.
CONTRAST:  75mL OMNIPAQUE IOHEXOL 300 MG/ML  SOLN

[Series 2: axial st · axial · 0.77mm/px · z∈[-311,-21]mm · 12 of 171 slices shown, 15 images]
[im 13/171  mediastinal]
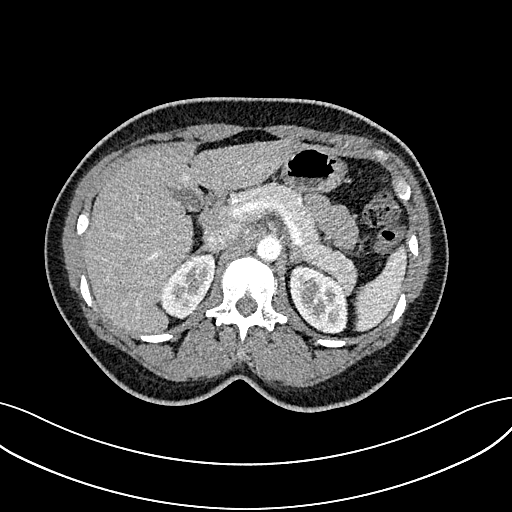
[im 13/171  lung]
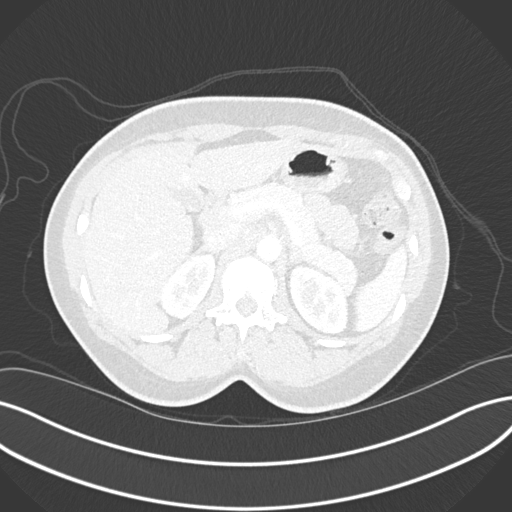
[im 25/171  lung]
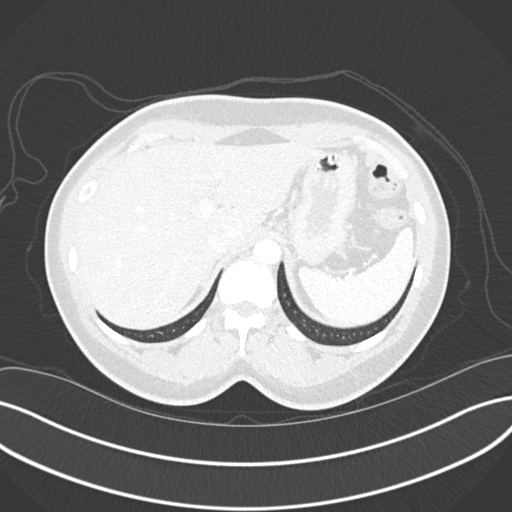
[im 37/171  lung]
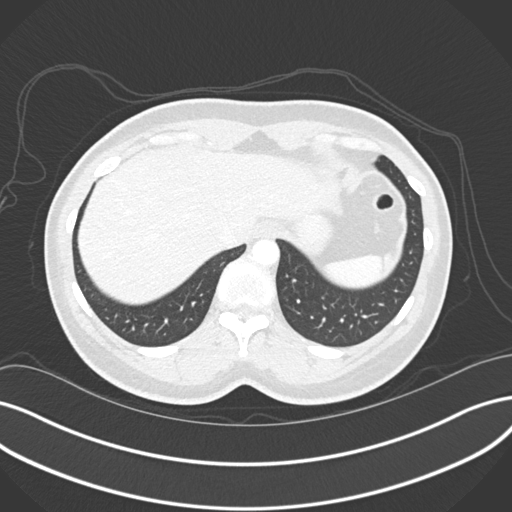
[im 49/171  lung]
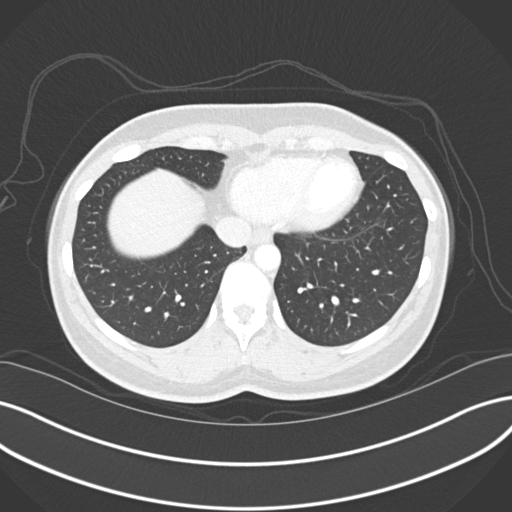
[im 61/171  mediastinal]
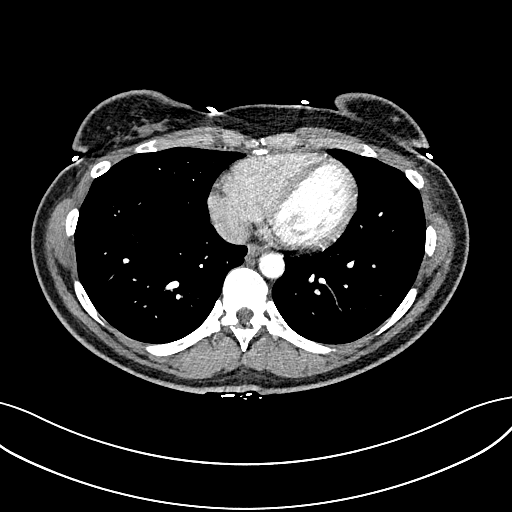
[im 61/171  lung]
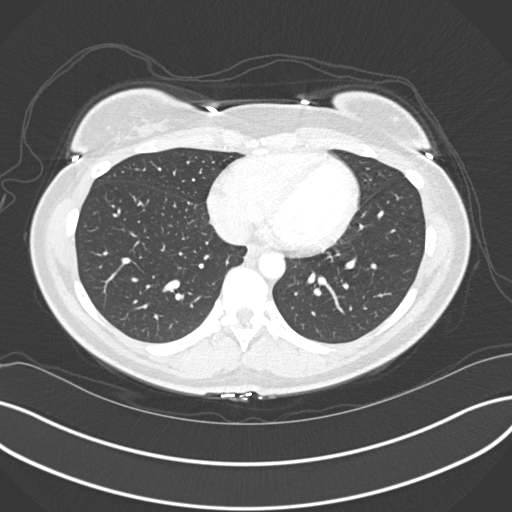
[im 73/171  lung]
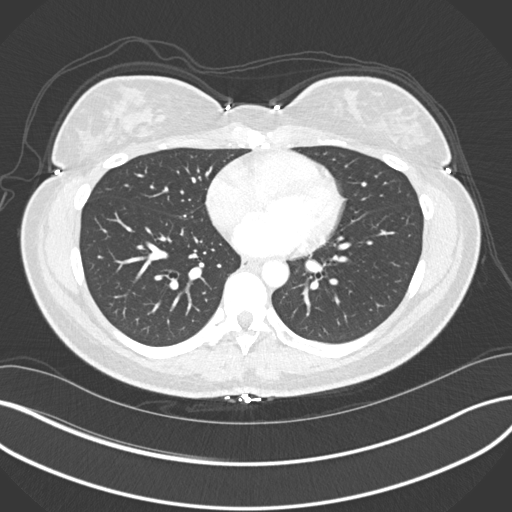
[im 98/171  lung]
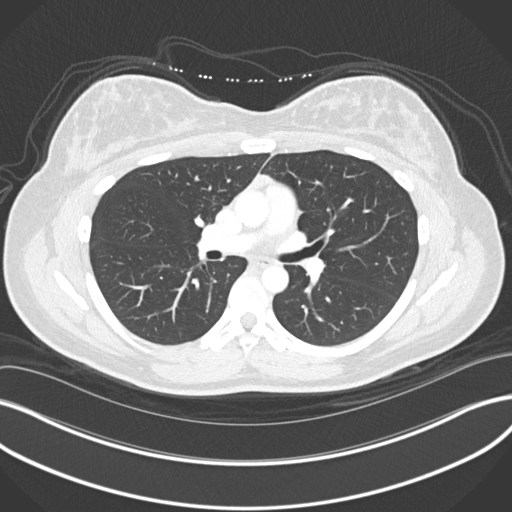
[im 110/171  lung]
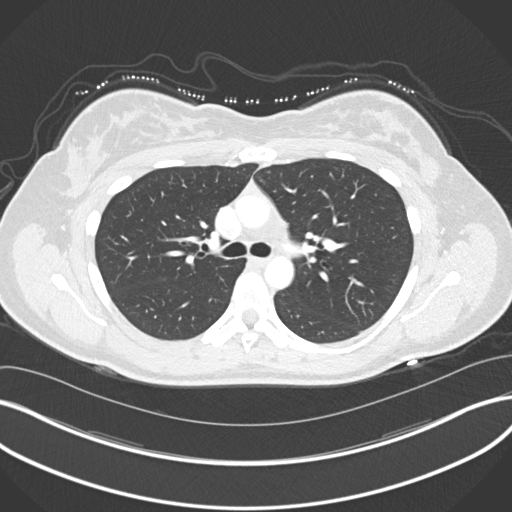
[im 122/171  mediastinal]
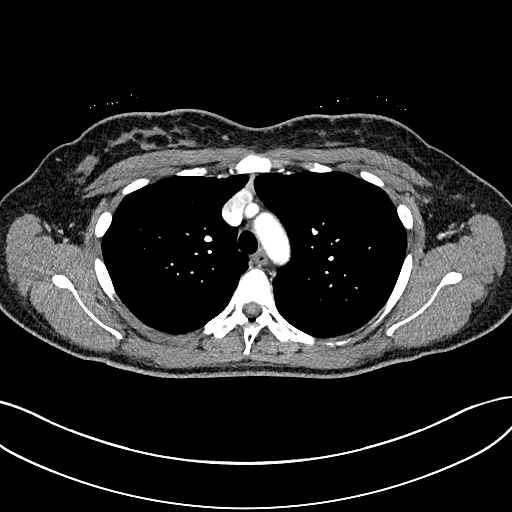
[im 122/171  lung]
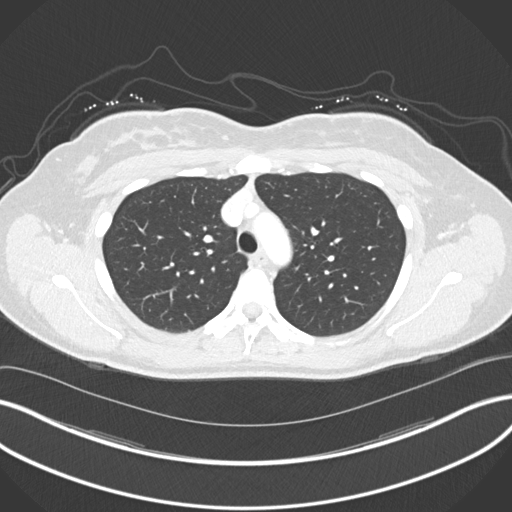
[im 134/171  lung]
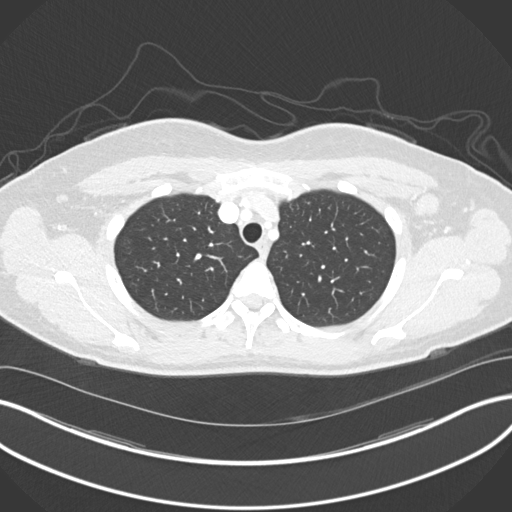
[im 146/171  lung]
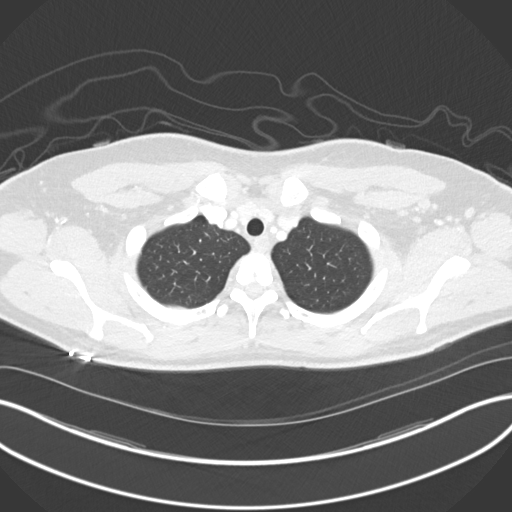
[im 158/171  lung]
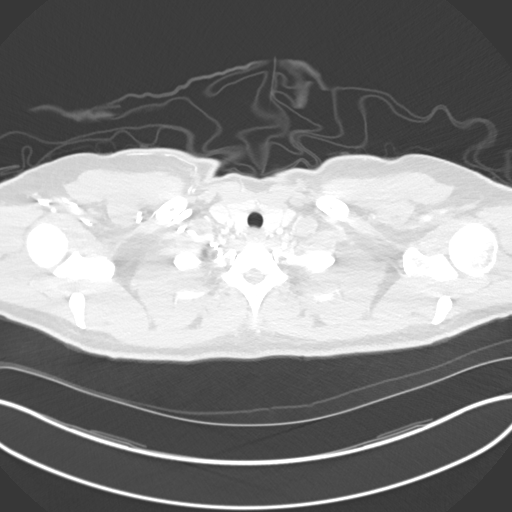

[Series 5: coronal · coronal · 0.72mm/px · 3 of 147 slices shown]
[im 30/147  lung]
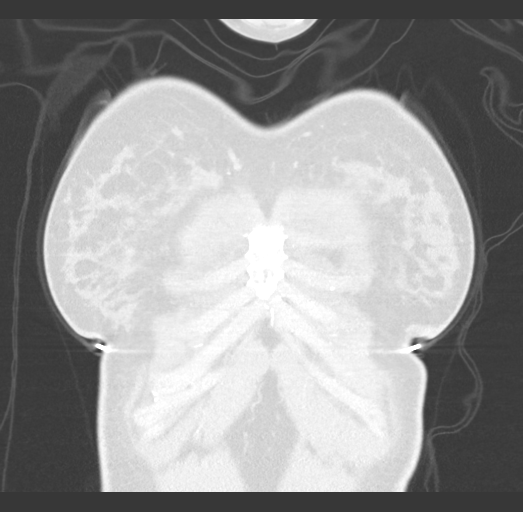
[im 59/147  lung]
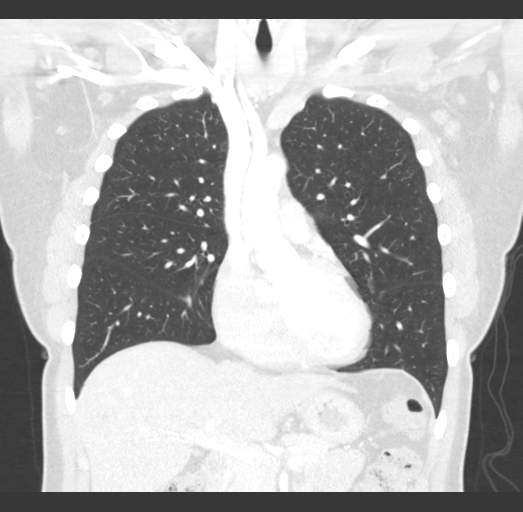
[im 88/147  lung]
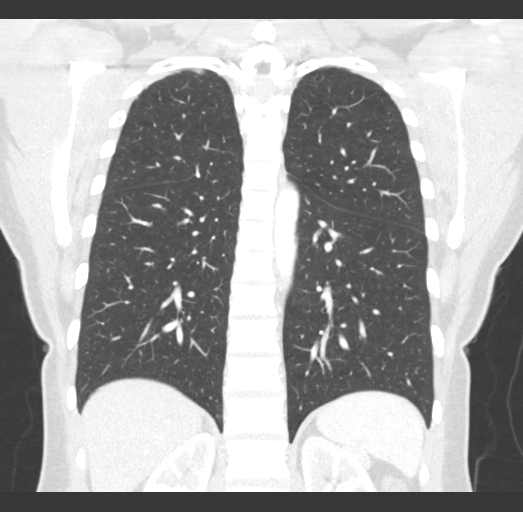

[15 of 36 positions shown; findings below may reference images not displayed]

FINDINGS: Cardiovascular: The heart size appears within normal limits. No
pericardial effusion.

Mediastinum/Nodes: Normal appearance of the thyroid gland. The
trachea appears patent and is midline. Normal appearance of the
esophagus. Left axillary adenopathy. Index lymph node measures
cm short axis, image 44/2. No supraclavicular or right axillary
adenopathy. No mediastinal or hilar adenopathy.

Lungs/Pleura: Lungs are clear. No pleural effusion or pneumothorax.
No suspicious lung nodules

Upper Abdomen: No acute abnormality.

Musculoskeletal: No chest wall abnormality. No acute or significant
osseous findings. No breast mass noted.
IMPRESSION: 1. Left axillary adenopathy compatible with metastatic adenopathy.
2. No additional sites of disease identified.
